# Patient Record
Sex: Male | Born: 1970
Health system: Southern US, Community
[De-identification: ages and names within clinical notes are randomized; demographics above are authoritative.]

## PROBLEM LIST (undated history)

## (undated) ENCOUNTER — Emergency Department (HOSPITAL_COMMUNITY): Admission: EM | Payer: Commercial Managed Care - PPO | Source: Home / Self Care

## (undated) DIAGNOSIS — L309 Dermatitis, unspecified: Secondary | ICD-10-CM

## (undated) DIAGNOSIS — K219 Gastro-esophageal reflux disease without esophagitis: Secondary | ICD-10-CM

## (undated) DIAGNOSIS — Z8619 Personal history of other infectious and parasitic diseases: Secondary | ICD-10-CM

## (undated) DIAGNOSIS — R51 Headache: Secondary | ICD-10-CM

## (undated) DIAGNOSIS — R490 Dysphonia: Secondary | ICD-10-CM

## (undated) DIAGNOSIS — E785 Hyperlipidemia, unspecified: Secondary | ICD-10-CM

## (undated) DIAGNOSIS — M545 Low back pain, unspecified: Secondary | ICD-10-CM

## (undated) HISTORY — DX: Dermatitis, unspecified: L30.9

## (undated) HISTORY — DX: Headache: R51

## (undated) HISTORY — DX: Low back pain, unspecified: M54.50

## (undated) HISTORY — PX: NO PAST SURGERIES: SHX2092

## (undated) HISTORY — DX: Hyperlipidemia, unspecified: E78.5

## (undated) HISTORY — DX: Gastro-esophageal reflux disease without esophagitis: K21.9

## (undated) HISTORY — DX: Personal history of other infectious and parasitic diseases: Z86.19

## (undated) HISTORY — DX: Dysphonia: R49.0

## (undated) HISTORY — DX: Low back pain: M54.5

---

## 2001-04-16 ENCOUNTER — Emergency Department (HOSPITAL_COMMUNITY): Admission: EM | Admit: 2001-04-16 | Discharge: 2001-04-16 | Payer: Self-pay | Admitting: Emergency Medicine

## 2001-04-16 ENCOUNTER — Encounter: Payer: Self-pay | Admitting: Emergency Medicine

## 2002-10-22 ENCOUNTER — Encounter: Payer: Self-pay | Admitting: Family Medicine

## 2002-10-22 ENCOUNTER — Encounter: Admission: RE | Admit: 2002-10-22 | Discharge: 2002-10-22 | Payer: Self-pay | Admitting: Family Medicine

## 2005-05-09 ENCOUNTER — Emergency Department (HOSPITAL_COMMUNITY): Admission: EM | Admit: 2005-05-09 | Discharge: 2005-05-09 | Payer: Self-pay | Admitting: Emergency Medicine

## 2006-01-27 ENCOUNTER — Emergency Department: Payer: Self-pay | Admitting: Emergency Medicine

## 2006-07-07 ENCOUNTER — Emergency Department: Payer: Self-pay

## 2007-04-24 ENCOUNTER — Ambulatory Visit: Payer: Self-pay | Admitting: Internal Medicine

## 2007-04-24 DIAGNOSIS — J309 Allergic rhinitis, unspecified: Secondary | ICD-10-CM | POA: Insufficient documentation

## 2007-04-24 DIAGNOSIS — R51 Headache: Secondary | ICD-10-CM

## 2007-04-24 LAB — CONVERTED CEMR LAB
ALT: 16 units/L (ref 0–53)
AST: 18 units/L (ref 0–37)
Albumin: 3.7 g/dL (ref 3.5–5.2)
Alkaline Phosphatase: 59 units/L (ref 39–117)
BUN: 8 mg/dL (ref 6–23)
Basophils Absolute: 0 10*3/uL (ref 0.0–0.1)
Basophils Relative: 0.6 % (ref 0.0–1.0)
Bilirubin, Direct: 0.1 mg/dL (ref 0.0–0.3)
CO2: 31 meq/L (ref 19–32)
Calcium: 9.3 mg/dL (ref 8.4–10.5)
Chloride: 106 meq/L (ref 96–112)
Cholesterol: 189 mg/dL (ref 0–200)
Creatinine, Ser: 1 mg/dL (ref 0.4–1.5)
Eosinophils Absolute: 0.3 10*3/uL (ref 0.0–0.6)
Eosinophils Relative: 6.4 % — ABNORMAL HIGH (ref 0.0–5.0)
GFR calc Af Amer: 109 mL/min
GFR calc non Af Amer: 90 mL/min
Glucose, Bld: 115 mg/dL — ABNORMAL HIGH (ref 70–99)
HCT: 39.9 % (ref 39.0–52.0)
HDL: 44.4 mg/dL (ref >39.0)
Hemoglobin: 12.8 g/dL — ABNORMAL LOW (ref 13.0–17.0)
LDL Cholesterol: 122 mg/dL — ABNORMAL HIGH (ref 0–99)
Lymphocytes Relative: 45.8 % (ref 12.0–46.0)
MCHC: 32.2 g/dL (ref 30.0–36.0)
MCV: 73 fL — ABNORMAL LOW (ref 78.0–100.0)
Monocytes Absolute: 0.3 10*3/uL (ref 0.2–0.7)
Monocytes Relative: 8.5 % (ref 3.0–11.0)
Neutro Abs: 1.5 10*3/uL (ref 1.4–7.7)
Neutrophils Relative %: 38.7 % — ABNORMAL LOW (ref 43.0–77.0)
Platelets: 279 10*3/uL (ref 150–400)
Potassium: 4.4 meq/L (ref 3.5–5.1)
RBC: 5.47 M/uL (ref 4.22–5.81)
RDW: 12 % (ref 11.5–14.6)
Sodium: 140 meq/L (ref 135–145)
TSH: 3.16 microintl units/mL (ref 0.35–5.50)
Total Bilirubin: 1 mg/dL (ref 0.3–1.2)
Total CHOL/HDL Ratio: 4.3
Total Protein: 7.3 g/dL (ref 6.0–8.3)
Triglycerides: 115 mg/dL (ref 0–149)
VLDL: 23 mg/dL (ref 0–40)
WBC: 4 10*3/uL — ABNORMAL LOW (ref 4.5–10.5)

## 2007-05-16 ENCOUNTER — Ambulatory Visit: Payer: Self-pay | Admitting: Family Medicine

## 2007-05-16 DIAGNOSIS — J069 Acute upper respiratory infection, unspecified: Secondary | ICD-10-CM | POA: Insufficient documentation

## 2007-06-19 ENCOUNTER — Encounter (INDEPENDENT_AMBULATORY_CARE_PROVIDER_SITE_OTHER): Payer: Self-pay | Admitting: *Deleted

## 2007-06-19 ENCOUNTER — Ambulatory Visit: Payer: Self-pay | Admitting: Internal Medicine

## 2007-06-19 DIAGNOSIS — J328 Other chronic sinusitis: Secondary | ICD-10-CM | POA: Insufficient documentation

## 2007-06-19 DIAGNOSIS — L309 Dermatitis, unspecified: Secondary | ICD-10-CM

## 2007-07-21 ENCOUNTER — Encounter: Payer: Self-pay | Admitting: Internal Medicine

## 2007-08-25 ENCOUNTER — Encounter: Payer: Self-pay | Admitting: Internal Medicine

## 2008-02-15 ENCOUNTER — Emergency Department: Payer: Self-pay | Admitting: Emergency Medicine

## 2008-03-10 ENCOUNTER — Ambulatory Visit: Payer: Self-pay | Admitting: Internal Medicine

## 2008-03-10 DIAGNOSIS — R490 Dysphonia: Secondary | ICD-10-CM | POA: Insufficient documentation

## 2008-03-11 ENCOUNTER — Ambulatory Visit: Payer: Self-pay | Admitting: Internal Medicine

## 2008-03-11 ENCOUNTER — Telehealth: Payer: Self-pay | Admitting: Internal Medicine

## 2008-03-17 ENCOUNTER — Encounter: Payer: Self-pay | Admitting: Internal Medicine

## 2008-03-21 ENCOUNTER — Encounter: Payer: Self-pay | Admitting: Internal Medicine

## 2008-04-07 ENCOUNTER — Encounter: Payer: Self-pay | Admitting: Internal Medicine

## 2008-06-28 ENCOUNTER — Ambulatory Visit: Payer: Self-pay | Admitting: Internal Medicine

## 2008-06-28 DIAGNOSIS — M545 Low back pain: Secondary | ICD-10-CM

## 2008-07-25 ENCOUNTER — Encounter: Admission: RE | Admit: 2008-07-25 | Discharge: 2008-08-18 | Payer: Self-pay | Admitting: Internal Medicine

## 2008-07-27 ENCOUNTER — Encounter: Payer: Self-pay | Admitting: Internal Medicine

## 2008-08-30 ENCOUNTER — Encounter: Payer: Self-pay | Admitting: Internal Medicine

## 2008-10-04 ENCOUNTER — Ambulatory Visit: Payer: Self-pay | Admitting: Family Medicine

## 2008-12-13 ENCOUNTER — Encounter: Payer: Self-pay | Admitting: Internal Medicine

## 2008-12-14 ENCOUNTER — Ambulatory Visit: Payer: Self-pay | Admitting: Internal Medicine

## 2008-12-27 ENCOUNTER — Ambulatory Visit: Payer: Self-pay | Admitting: Internal Medicine

## 2008-12-28 ENCOUNTER — Encounter: Payer: Self-pay | Admitting: Internal Medicine

## 2008-12-28 LAB — CONVERTED CEMR LAB
ALT: 13 units/L (ref 0–53)
AST: 17 units/L (ref 0–37)
Albumin: 4.6 g/dL (ref 3.5–5.2)
Alkaline Phosphatase: 49 units/L (ref 39–117)
BUN: 12 mg/dL (ref 6–23)
Basophils Absolute: 0 10*3/uL (ref 0.0–0.1)
Basophils Relative: 1 % (ref 0–1)
Bilirubin, Direct: 0.1 mg/dL (ref 0.0–0.3)
CO2: 20 meq/L (ref 19–32)
Calcium: 9.6 mg/dL (ref 8.4–10.5)
Chloride: 105 meq/L (ref 96–112)
Cholesterol: 272 mg/dL — ABNORMAL HIGH (ref 0–200)
Creatinine, Ser: 1.24 mg/dL (ref 0.40–1.50)
Eosinophils Absolute: 0.1 10*3/uL (ref 0.0–0.7)
Eosinophils Relative: 2 % (ref 0–5)
Glucose, Bld: 112 mg/dL — ABNORMAL HIGH (ref 70–99)
HCT: 40.1 % (ref 39.0–52.0)
HDL: 55 mg/dL (ref >39)
Hemoglobin: 13 g/dL (ref 13.0–17.0)
Indirect Bilirubin: 0.6 mg/dL (ref 0.0–0.9)
LDL Cholesterol: 195 mg/dL — ABNORMAL HIGH (ref 0–99)
Lymphocytes Relative: 37 % (ref 12–46)
Lymphs Abs: 1.7 10*3/uL (ref 0.7–4.0)
MCHC: 32.4 g/dL (ref 30.0–36.0)
MCV: 71.5 fL — ABNORMAL LOW (ref 78.0–100.0)
Monocytes Absolute: 0.4 10*3/uL (ref 0.1–1.0)
Monocytes Relative: 9 % (ref 3–12)
Neutro Abs: 2.4 10*3/uL (ref 1.7–7.7)
Neutrophils Relative %: 52 % (ref 43–77)
Platelets: 279 10*3/uL (ref 150–400)
Potassium: 4.7 meq/L (ref 3.5–5.3)
RBC: 5.61 M/uL (ref 4.22–5.81)
RDW: 12.6 % (ref 11.5–15.5)
Sodium: 141 meq/L (ref 135–145)
TSH: 1.078 microintl units/mL (ref 0.350–4.500)
Total Bilirubin: 0.7 mg/dL (ref 0.3–1.2)
Total CHOL/HDL Ratio: 4.9
Total Protein: 7.4 g/dL (ref 6.0–8.3)
Triglycerides: 110 mg/dL (ref <150)
VLDL: 22 mg/dL (ref 0–40)
WBC: 4.6 10*3/uL (ref 4.0–10.5)

## 2009-01-02 ENCOUNTER — Telehealth: Payer: Self-pay | Admitting: Internal Medicine

## 2009-01-13 ENCOUNTER — Encounter: Payer: Self-pay | Admitting: Internal Medicine

## 2009-01-23 ENCOUNTER — Encounter: Payer: Self-pay | Admitting: Internal Medicine

## 2009-02-28 ENCOUNTER — Encounter: Payer: Self-pay | Admitting: Internal Medicine

## 2009-05-17 ENCOUNTER — Encounter: Payer: Self-pay | Admitting: Internal Medicine

## 2010-03-26 ENCOUNTER — Ambulatory Visit: Payer: Self-pay | Admitting: Internal Medicine

## 2010-03-28 ENCOUNTER — Telehealth: Payer: Self-pay | Admitting: Internal Medicine

## 2010-03-28 ENCOUNTER — Encounter: Payer: Self-pay | Admitting: Internal Medicine

## 2010-03-28 LAB — CONVERTED CEMR LAB
ALT: 23 units/L (ref 0–53)
AST: 25 units/L (ref 0–37)
Albumin: 4.5 g/dL (ref 3.5–5.2)
Alkaline Phosphatase: 74 units/L (ref 39–117)
BUN: 14 mg/dL (ref 6–23)
Basophils Absolute: 0 10*3/uL (ref 0.0–0.1)
Basophils Relative: 0 % (ref 0–1)
Bilirubin, Direct: 0.2 mg/dL (ref 0.0–0.3)
CO2: 26 meq/L (ref 19–32)
Calcium: 9.5 mg/dL (ref 8.4–10.5)
Chloride: 102 meq/L (ref 96–112)
Cholesterol: 235 mg/dL — ABNORMAL HIGH (ref 0–200)
Creatinine, Ser: 1.16 mg/dL (ref 0.40–1.50)
Eosinophils Absolute: 0.4 10*3/uL (ref 0.0–0.7)
Eosinophils Relative: 3 % (ref 0–5)
Glucose, Bld: 101 mg/dL — ABNORMAL HIGH (ref 70–99)
HCT: 40.4 % (ref 39.0–52.0)
HDL: 60 mg/dL (ref >39)
Hemoglobin: 13.4 g/dL (ref 13.0–17.0)
Indirect Bilirubin: 0.8 mg/dL (ref 0.0–0.9)
LDL Cholesterol: 154 mg/dL — ABNORMAL HIGH (ref 0–99)
Lymphocytes Relative: 13 % (ref 12–46)
Lymphs Abs: 1.7 10*3/uL (ref 0.7–4.0)
MCHC: 33.2 g/dL (ref 30.0–36.0)
MCV: 70.5 fL — ABNORMAL LOW (ref 78.0–100.0)
Monocytes Absolute: 1.4 10*3/uL — ABNORMAL HIGH (ref 0.1–1.0)
Monocytes Relative: 11 % (ref 3–12)
Neutro Abs: 9.7 10*3/uL — ABNORMAL HIGH (ref 1.7–7.7)
Neutrophils Relative %: 74 % (ref 43–77)
Platelets: 241 10*3/uL (ref 150–400)
Potassium: 4.3 meq/L (ref 3.5–5.3)
RBC: 5.73 M/uL (ref 4.22–5.81)
RDW: 12.9 % (ref 11.5–15.5)
Sodium: 140 meq/L (ref 135–145)
TSH: 2.497 microintl units/mL (ref 0.350–4.500)
Total Bilirubin: 1 mg/dL (ref 0.3–1.2)
Total CHOL/HDL Ratio: 3.9
Total Protein: 7.5 g/dL (ref 6.0–8.3)
Triglycerides: 105 mg/dL (ref <150)
VLDL: 21 mg/dL (ref 0–40)
WBC: 13.2 10*3/uL — ABNORMAL HIGH (ref 4.0–10.5)

## 2010-04-02 ENCOUNTER — Ambulatory Visit: Payer: Self-pay | Admitting: Internal Medicine

## 2010-04-02 DIAGNOSIS — E785 Hyperlipidemia, unspecified: Secondary | ICD-10-CM | POA: Insufficient documentation

## 2010-06-05 NOTE — Miscellaneous (Signed)
Summary: lab orders  Clinical Lists Changes  Orders: Added new Test order of T-Basic Metabolic Panel ([id number]) - Signed Added new Test order of T-Hepatic Function ([id number]) - Signed Added new Test order of T-Lipid Profile ([id number]) - Signed Added new Test order of T-CBC w/Diff ([id number]) - Signed Added new Test order of T-TSH ([id number]) - Signed 

## 2010-06-05 NOTE — Assessment & Plan Note (Signed)
Summary: HA/Body aches/fever/ST/hea   Vital Signs:  Patient profile:   40 year old male Weight:      167.75 pounds BMI:     25.60 O2 Sat:      98 % on Room air Temp:     99.2 degrees F oral Pulse rate:   99 / minute BP sitting:   100 / 70  (left arm) Cuff size:   large  Vitals Entered By: Glendell Docker CMA (March 26, 2010 3:56 PM)  O2 Flow:  Room air CC: Body Aches Is Patient Diabetic? No Pain Assessment Patient in pain? no        Primary Care Provider:  Dondra Spry DO  CC:  Body Aches.  History of Present Illness:  40 year old male complains of severe sore throat, body aches, headaches, and fever No improvement with over-the-counter TheraFlu  Preventive Screening-Counseling & Management  Alcohol-Tobacco     Smoking Status: never  Allergies (verified): No Known Drug Allergies  Past History:  Past Medical History: Headache Allergic rhinitis    Hx of low back pain 2004 - associated with work injury   Family History: Father is in his early 87s - no significant medical history Mother is in her early 81s - no significant medical history Denies family history of colon cancer, diabetes, coronary artery disease   Social History: Occupation:  Oceanographer  Married with 4 children He is originally from [place] Never Smoked Alcohol use-no Drug use-no     Physical Exam  General:  alert, well-developed, and well-nourished.   Mouth:  pharyngeal erythema.   Neck:  supple.  bilateral neck tenderness Lungs:  normal respiratory effort and normal breath sounds.   Heart:  normal rate, regular rhythm, and no gallop.     Impression & Recommendations:  Problem # 1:  PHARYNGITIS-ACUTE (ICD-462)  His updated medication list for this problem includes:    Ibuprofen 200 Mg Tabs (Ibuprofen) .Marland Kitchen... Take 1 tablet by mouth two times a day as needed    Cefuroxime Axetil 500 Mg Tabs (Cefuroxime axetil) ..... One by mouth two times a day  Instructed to complete  antibiotics and call if not improved in 48 hours.   Complete Medication List: 1)  Ibuprofen 200 Mg Tabs (Ibuprofen) .... Take 1 tablet by mouth two times a day as needed 2)  Cyclobenzaprine Hcl 5 Mg Tabs (Cyclobenzaprine hcl) .... One by mouth at bedtime prn1 3)  Omeprazole 20 Mg Cpdr (Omeprazole) .... One by mouth qam 30 mins before meals 4)  Cefuroxime Axetil 500 Mg Tabs (Cefuroxime axetil) .... One by mouth two times a day 5)  Triamcinolone Acetonide 0.1 % Crea (Triamcinolone acetonide) .... Apply two times a day x 2 wks  Patient Instructions: 1)  Call our office if your symptoms do not  improve or gets worse. Prescriptions: CEFUROXIME AXETIL 500 MG TABS (CEFUROXIME AXETIL) one by mouth two times a day  #20 x 0   Entered and Authorized by:   D. Thomos Lemons DO   Signed by:   D. Thomos Lemons DO on 03/26/2010   Method used:   Electronically to        [facility] [phone]* (retail)       [address]       [place], [place]  [address]       Ph: [phone]       Fax: [phone]   RxID:   [id number]    Orders Added: 1)  Est. Patient  Level III [95284]    Current Allergies (reviewed today): No known allergies

## 2010-06-05 NOTE — Assessment & Plan Note (Signed)
Summary: CPX/HEA   Vital Signs:  Patient profile:   40 year old male Height:      68 inches Weight:      168.50 pounds BMI:     25.71 O2 Sat:      100 % on Room air Temp:     97.9 degrees F oral Resp:     58 per minute BP sitting:   110 / 80  (left arm) Cuff size:   large  Vitals Entered By: Glendell Docker CMA (April 02, 2010 2:41 PM)  O2 Flow:  Room air CC: CPX Is Patient Diabetic? No Pain Assessment Patient in pain? no      Comments 3 days remaining on antoibiotic   Primary Care Provider:  Dondra Spry DO  CC:  CPX.  History of Present Illness: 40 y/o AA male for routine cpx  recently seen for strep throat - feeling much better  Preventive Screening-Counseling & Management  Alcohol-Tobacco     Alcohol drinks/day: 0     Smoking Status: never  Caffeine-Diet-Exercise     Caffeine use/day: None     Does Patient Exercise: no  Allergies (verified): No Known Drug Allergies  Past History:  Past Medical History: Headache Allergic rhinitis    Hx of low back pain 2004 - associated with work injury    Family History: Father is in his early 36s - no significant medical history Mother is in her early 88s - no significant medical history Denies family history of colon cancer, diabetes, coronary artery disease    Social History: 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    Physical Exam  General:  alert, well-developed, and well-nourished.   Head:  normocephalic and atraumatic.   Ears:  R ear normal and L ear normal.   Mouth:  pharynx pink and moist.   Neck:  No deformities, masses, or tenderness  noted. Lungs:  Normal respiratory effort, chest expands symmetrically. Lungs are clear to auscultation, no crackles or wheezes. Heart:  normal rate, regular rhythm, and no gallop.   Abdomen:  soft, non-tender, normal bowel sounds, no masses, no hepatomegaly, and no splenomegaly.   Extremities:  No lower extremity edema  Neurologic:  cranial nerves II-XII intact and gait normal.     Impression & Recommendations:  Problem # 1:  HEALTH MAINTENANCE EXAM (ICD-V70.0) Reviewed adult health maintenance protocols. Pt counseled on diet and exercise.  Td Booster: Tdap (04/02/2010)   Flu Vax: Declined (04/02/2010)   Chol: 272 (12/28/2008)   HDL: 55 (12/28/2008)   LDL: 195 (12/28/2008)   TG: 110 (12/28/2008) TSH: 1.078 (12/28/2008)     Problem # 2:  HYPERLIPIDEMIA (ICD-272.4) I provided educational material.  His updated medication list for this problem includes:    Lovastatin 20 Mg Tabs (Lovastatin) ..... One by mouth q pm  Labs Reviewed: SGOT: 17 (12/28/2008)   SGPT: 13 (12/28/2008)   HDL:55 (12/28/2008), 44.4 (04/24/2007)  LDL:195 (12/28/2008), 122 (04/24/2007)  Chol:272 (12/28/2008), 189 (04/24/2007)  Trig:110 (12/28/2008), 115 (04/24/2007)  Complete Medication List: 1)  Ibuprofen 200 Mg Tabs (Ibuprofen) .... Take 1 tablet by mouth two times a day as needed 2)  Cyclobenzaprine Hcl 5 Mg Tabs (Cyclobenzaprine hcl) .Marland KitchenMarland KitchenMarland Kitchen  One by mouth at bedtime prn1 3)  Omeprazole 20 Mg Cpdr (Omeprazole) .... One by mouth qam 30 mins before meals 4)  Cefuroxime Axetil 500 Mg Tabs (Cefuroxime axetil) .... One by mouth two times a day 5)  Triamcinolone Acetonide 0.1 % Crea (Triamcinolone acetonide) .... Apply two times a day x 2 wks 6)  Lovastatin 20 Mg Tabs (Lovastatin) .... One by mouth q pm  Other Orders: Tdap => 72yrs IM ([address]109) Admin 1st Vaccine (60454)  Patient Instructions: 1)  Please schedule a follow-up appointment in 6 months. 2)  Hepatic Panel prior to visit, ICD-9:  272.4 3)  Lipid Panel prior  to visit, ICD-9: 272.4 4)  High sensitivity CRP :  272.4 5)  Please return for lab work one (1) week before your next appointment.  Prescriptions: LOVASTATIN 20 MG TABS (LOVASTATIN) one by mouth q pm  #90 x 1   Entered and Authorized by:   D. Thomos Lemons DO   Signed by:   D. Thomos Lemons DO on 04/02/2010   Method used:   Electronically to        [facility] [phone]* (retail)       [address]       [place], [place]  [address]       Ph: [phone]       Fax: [phone]   RxID:   [id number]    Orders Added: 1)  Tdap => 3yrs IM [90715] 2)  Admin 1st Vaccine [90471] 3)  Est. Patient 18-39 years [99395]   Immunization History:  Influenza Immunization History:    Influenza:  declined (04/02/2010)  Immunizations Administered:  Tetanus Vaccine:    Vaccine Type: Tdap    Site: left deltoid    Mfr: GlaxoSmithKline    Dose: 0.5 ml    Route: IM    Given by: Glendell Docker CMA    Exp. Date: 02/23/2012    Lot #: [id number]    VIS given: 03/23/08 version given April 02, 2010.   Contraindications/Deferment of Procedures/Staging:    Test/Procedure: FLU VAX    Reason for deferment: patient declined   Immunization History:  Influenza Immunization History:    Influenza:  Declined (04/02/2010)  Immunizations Administered:  Tetanus Vaccine:    Vaccine Type: Tdap    Site: left deltoid    Mfr: GlaxoSmithKline    Dose: 0.5 ml    Route: IM    Given by: Glendell Docker CMA    Exp. Date: 02/23/2012    Lot #: [id number]    VIS given: 03/23/08 version given April 02, 2010.   Orders Added: 1)  Tdap => 83yrs IM [90715] 2)  Admin 1st Vaccine [90471] 3)  Est. Patient 18-39 years [99395]   Current Allergies (reviewed today): No known allergies

## 2010-06-05 NOTE — Letter (Signed)
Summary: [facility]  [facility]   Imported By: Lanelle Bal 06/05/2009 12:58:16  _____________________________________________________________________  External Attachment:    Type:   Image     Comment:   External Document

## 2010-06-05 NOTE — Progress Notes (Signed)
Summary: PLEASE SEND RX FOR RASH CREAM --LM 11/23  Phone Note Call from Patient   Caller: Patient Call For: YOO  Summary of Call: DR Artist Pais WAS TO SEND RX FOR A CREAM FOR THE RASH THE PATIENT SHOWED HIM AT THE VISIT.  PLEASE SEND TO [facility] [address]  Initial call taken by: Roselle Locus,  March 28, 2010 11:33 AM  Follow-up for Phone Call        Rx sent to pharmacy. Left message for pt to return my call. Nicki Guadalajara Fergerson CMA Duncan Dull)  March 28, 2010 1:25 PM   Additional Follow-up for Phone Call Additional follow up Details #1::        call returne to patient at [phone], no answer. A detailed voice message was left informing patient rx sent to pharmacy. Message left for patient to call if any questions Additional Follow-up by: Glendell Docker CMA,  March 28, 2010 2:17 PM    New/Updated Medications: TRIAMCINOLONE ACETONIDE 0.1 % CREA (TRIAMCINOLONE ACETONIDE) apply two times a day x 2 wks Prescriptions: TRIAMCINOLONE ACETONIDE 0.1 % CREA (TRIAMCINOLONE ACETONIDE) apply two times a day x 2 wks  #30 grams x 1   Entered and Authorized by:   D. Thomos Lemons DO   Signed by:   D. Thomos Lemons DO on 03/28/2010   Method used:   Electronically to        [facility] [phone]* (retail)       [address]       [place], [place]  [address]       Ph: [phone]       Fax: [phone]   RxID:   [id number]

## 2010-09-27 ENCOUNTER — Encounter: Payer: Self-pay | Admitting: Internal Medicine

## 2010-10-05 ENCOUNTER — Ambulatory Visit: Payer: Self-pay | Admitting: Internal Medicine

## 2010-10-18 ENCOUNTER — Ambulatory Visit: Payer: Self-pay | Admitting: Internal Medicine

## 2010-10-22 ENCOUNTER — Other Ambulatory Visit: Payer: Self-pay | Admitting: Family Medicine

## 2010-10-22 ENCOUNTER — Encounter: Payer: Self-pay | Admitting: *Deleted

## 2010-10-22 ENCOUNTER — Ambulatory Visit (INDEPENDENT_AMBULATORY_CARE_PROVIDER_SITE_OTHER): Payer: BC Managed Care – PPO | Admitting: Family Medicine

## 2010-10-22 ENCOUNTER — Encounter: Payer: Self-pay | Admitting: Family Medicine

## 2010-10-22 VITALS — BP 110/80 | HR 76 | Temp 98.3°F | Resp 18 | Ht 68.0 in | Wt 166.0 lb

## 2010-10-22 DIAGNOSIS — J309 Allergic rhinitis, unspecified: Secondary | ICD-10-CM

## 2010-10-22 DIAGNOSIS — Z0289 Encounter for other administrative examinations: Secondary | ICD-10-CM

## 2010-10-22 DIAGNOSIS — Z23 Encounter for immunization: Secondary | ICD-10-CM

## 2010-10-22 DIAGNOSIS — IMO0001 Reserved for inherently not codable concepts without codable children: Secondary | ICD-10-CM

## 2010-10-22 DIAGNOSIS — K219 Gastro-esophageal reflux disease without esophagitis: Secondary | ICD-10-CM

## 2010-10-22 DIAGNOSIS — R498 Other voice and resonance disorders: Secondary | ICD-10-CM

## 2010-10-22 DIAGNOSIS — E785 Hyperlipidemia, unspecified: Secondary | ICD-10-CM

## 2010-10-22 DIAGNOSIS — Z020 Encounter for examination for admission to educational institution: Secondary | ICD-10-CM

## 2010-10-22 DIAGNOSIS — L259 Unspecified contact dermatitis, unspecified cause: Secondary | ICD-10-CM

## 2010-10-22 MED ORDER — RANITIDINE HCL 300 MG PO TABS: 300.0000 mg | ORAL_TABLET | Freq: Every day | ORAL | Status: DC

## 2010-10-22 NOTE — Patient Instructions (Signed)
Heartburn Heartburn is a painful, burning sensation in the chest. It may feel worse in certain positions, such as lying down or bending over. It is caused by stomach acid backing up into the tube that carries food from the mouth down to the stomach (lower esophagus).  CAUSES A number of conditions can cause or worsen heartburn, including:   Pregnancy.   Being overweight (obesity).   A condition called hiatal hernia, in which part or all of the stomach is moved up into the chest through a weakness in the diaphragm muscle.   Alcohol.   Exercise.   Eating just before going to bed.   Overeating.   Medications, including:   Nonsteroidal anti-inflammatory drugs, such as ibuprofen and naproxen.   Aspirin.   Some blood pressure medicines, including beta-blockers, calcium channel blockers, and alpha-blockers.   Nitrates (used to treat angina).   The asthma medication Theophylline.   Certain sedative drugs.   Heartburn may be worse after eating certain foods. These heartburn-causing foods are different for different people, but may include:   Peppers.   Chocolate.   Coffee.   High-fat foods, including fried foods.   Spicy foods.   Garlic, onions.   Citrus fruits, including oranges, grapefruit, lemons and limes.   Food containing tomatoes or tomato products.   Mint.   Carbonated beverages.   Vinegar.  SYMPTOMS  Symptoms may last for a few minutes or a few hours, and can include:  Burning pain in the chest or lower throat.   Bitter taste in the mouth.   Coughing.  DIAGNOSIS If the usual treatments for heartburn do not improve your symptoms, then tests may be done to see if there is another condition present. Possible tests may include:  X-rays.   Endoscopy. This is when a tube with a light and a camera on the end is used to examine the esophagus and the stomach.   Blood, breath, or stool tests may be used to check for bacteria that cause ulcers.   TREATMENT There are a number of non-prescription medicines used to treat heartburn, including:  Antacids.   Acid reducers (also called H-2 blockers).   Proton-pump inhibitors.  HOME CARE INSTRUCTIONS  Raise the head of your bed by putting blocks under the legs.   Eat 2-3 hours before going to bed.   Stop smoking.   Try to reach and maintain a healthy weight.   Do not eat just a few very large meals. Instead, eat many smaller meals throughout the day.   Try to identify foods and beverages that make your symptoms worse, and avoid these.   Avoid tight clothing.   Do not exercise right after eating.  SEEK IMMEDIATE MEDICAL CARE IF YOU:  Have severe chest pain that goes down your arm, or into your jaw or neck.   Feel sweaty, dizzy, or lightheaded.   Are short of breath.   Throw up (vomit) blood.   Have difficulty or pain with swallowing.   Have bloody or black, tarry stools.   Have bouts of heartburn more than three times a week for more than two weeks.  Document Released: 09/08/2008 Document Re-Released: 07/17/2009 [facility] Patient Information 2011 [place], [place].   If your hoarseness does not get better on the Ranitidine after a month or so add Loratadine (Claritin) 10 mg daily and take both for a month to see if it gets better, if it does not please come in to have this reevaluated

## 2010-10-22 NOTE — Telephone Encounter (Signed)
This encounter was created in error - please disregard.

## 2010-10-23 LAB — RUBEOLA ANTIBODY IGG: Rubeola IgG: 5.14 {ISR} — ABNORMAL HIGH

## 2010-10-23 LAB — VARICELLA ZOSTER ANTIBODY, IGG: Varicella IgG: 1.76 {ISR} — ABNORMAL HIGH

## 2010-10-23 LAB — HEPATITIS PANEL, ACUTE
Hep A IgM: NEGATIVE
Hep B C IgM: NEGATIVE
Hepatitis B Surface Ag: NEGATIVE

## 2010-10-24 ENCOUNTER — Encounter: Payer: Self-pay | Admitting: Family Medicine

## 2010-10-24 DIAGNOSIS — R490 Dysphonia: Secondary | ICD-10-CM | POA: Insufficient documentation

## 2010-10-24 DIAGNOSIS — K219 Gastro-esophageal reflux disease without esophagitis: Secondary | ICD-10-CM | POA: Insufficient documentation

## 2010-10-24 DIAGNOSIS — L309 Dermatitis, unspecified: Secondary | ICD-10-CM | POA: Insufficient documentation

## 2010-10-24 DIAGNOSIS — M549 Dorsalgia, unspecified: Secondary | ICD-10-CM | POA: Insufficient documentation

## 2010-10-24 DIAGNOSIS — J309 Allergic rhinitis, unspecified: Secondary | ICD-10-CM | POA: Insufficient documentation

## 2010-10-24 DIAGNOSIS — Z020 Encounter for examination for admission to educational institution: Secondary | ICD-10-CM | POA: Insufficient documentation

## 2010-10-24 DIAGNOSIS — E785 Hyperlipidemia, unspecified: Secondary | ICD-10-CM | POA: Insufficient documentation

## 2010-10-24 NOTE — Progress Notes (Signed)
Cody Frank [id number] Nov 01, 1970 10/24/2010      Progress Note-Follow Up  Subjective  Chief Complaint  No chief complaint on file.   HPI  Patient is a 40 year old [place] American male from [place] who is in today for school physical. He has been accepted into her respiratory therapy to program at [facility]. He needs to prove immunity so he will be sent for lab work today. He continues to have intermittent episodes of hoarseness and low back pain. The low back pain does respond in a dermatome and Flexeril when necessary and he has none today. The hoarseness is present slightly today but comes and goes. He does have intermittent mild reflux and intermittent nasal. Is not taking any medications for these at this time. No recent febrile illness, chest pain, palpitations, shortness of breath her symptoms are noted at this time.  Past Medical History  Diagnosis Date  . Headache   . History of low back pain 2004    associated with work injury  . Allergic rhinitis   . Hyperlipidemia   . GERD (gastroesophageal reflux disease)   . Eczema   . Low back pain syndrome   . Hoarseness   . School physical exam 10/24/2010    History reviewed. No pertinent past surgical history.  Family History  Problem Relation Age of Onset  . Other Neg Hx     father in his early 49's and mother in her early 92's no significant  medical history  . Other Neg Hx     denies family history of colon cancer, diabetes, coronary artery disease    History   Social History  . Marital Status: Married    Spouse Name: N/A    Number of Children: N/A  . Years of Education: N/A   Occupational History  . Not on file.   Social History Main Topics  . Smoking status: Never Smoker   . Smokeless tobacco: Not on file  . Alcohol Use: No  . Drug Use: No  . Sexually Active: Not on file   Other Topics Concern  . 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 cyclobenzaprine (FLEXERIL) 5 MG tablet Take 5 mg by mouth at bedtime as needed.        . triamcinolone (KENALOG) 0.1 % cream Apply topically 2 (two) times daily.          Not on File  Review of Systems  Review of Systems  Constitutional: Negative for fever, chills and malaise/fatigue.  HENT: Positive for congestion. Negative for hearing loss, nosebleeds and sore throat.        Mild hoarseness, intermittent  Eyes: Negative for discharge.  Respiratory: Negative for cough, sputum production, shortness of breath and wheezing.   Cardiovascular: Negative for chest pain, palpitations and leg swelling.  Gastrointestinal: Positive for heartburn. Negative for nausea, vomiting, abdominal pain, diarrhea, constipation and blood in stool.  Genitourinary: Negative for dysuria, urgency, frequency and hematuria.  Musculoskeletal: Positive for back pain. Negative for myalgias and falls.       Occasional low back pain, none now, responsive to Flexeril and Nabumetone   Skin: Negative for rash.  Neurological: Negative for dizziness, tremors, sensory change, focal weakness, loss of consciousness, weakness and headaches.  Endo/Heme/Allergies: Negative for polydipsia. Does not bruise/bleed easily.  Psychiatric/Behavioral: Negative for depression and suicidal ideas. The patient is not nervous/anxious and does not have insomnia.     Objective  BP 110/80  Pulse 76  Temp(Src) 98.3 F (36.8 C) (Oral)  Resp 18  Ht 5\' 8"  (1.727 m)  Wt 166 lb (75.297 kg)  BMI 25.24 kg/m2  Physical Exam  Physical Exam  Constitutional: He is oriented to person, place, and time and well-developed, well-nourished, and in no distress. No distress.  HENT:  Head: Normocephalic and atraumatic.  Eyes: Conjunctivae are  normal.  Neck: Neck supple. No thyromegaly present.  Cardiovascular: Normal rate, regular rhythm and normal heart sounds.   No murmur heard. Pulmonary/Chest: Effort normal and breath sounds normal. No respiratory distress.  Abdominal: He exhibits no distension and no mass. There is no tenderness.  Musculoskeletal: He exhibits no edema.  Neurological: He is alert and oriented to person, place, and time.  Skin: Skin is warm. Rash noted.       Small, raised, rough circular patch along medial aspect of right elbow. No surrounding fluctuance, roughly 1 cm in diameter  Psychiatric: Memory, affect and judgment normal.    Lab Results  Component Value Date   TSH 2.497 03/28/2010   Lab Results  Component Value Date   WBC 13.2* 03/28/2010   HGB 13.4 03/28/2010   HCT 40.4 03/28/2010   MCV 70.5* 03/28/2010   PLT 241 03/28/2010   Lab Results  Component Value Date   CREATININE 1.16 03/28/2010   BUN 14 03/28/2010   NA 140 03/28/2010   K 4.3 03/28/2010   CL 102 03/28/2010   CO2 26 03/28/2010   Lab Results  Component Value Date   ALT 23 03/28/2010   AST 25 03/28/2010   ALKPHOS 74 03/28/2010   BILITOT 1.0 03/28/2010   Lab Results  Component Value Date   CHOL 235* 03/28/2010   Lab Results  Component Value Date   HDL 60 03/28/2010   Lab Results  Component Value Date   LDLCALC 154* 03/28/2010   Lab Results  Component Value Date   TRIG 105 03/28/2010   Lab Results  Component Value Date   CHOLHDL 3.9 Ratio 03/28/2010     Assessment & Plan  HYPERLIPIDEMIA Is no longer taking his statin but is trying to avoid fatty foods and he is encouraged to take fish oil supplements daily and stay active.  GERD (gastroesophageal reflux disease) Has intermittent symptoms, encouraged to avoid offending foods and not eat too close to bedtime, is started on Ranitidine to see if it helps his hoarseness as well  ECZEMA Well controlled at present. May continue topical treatments prn and  needs to maintain adequate hydration.  ALLERGIC RHINITIS Encouraged a daily dose of either Loratadine or Cetirizine to see if the hoarseness improves.  HOARSENESS Will begin treatment for both reflux and allergies then if hoarseness persists he will need to notify the office so he can obtain referral to ENT for evaluation of his oropharynx and vocal cords.   School physical exam Has been accepted to [facility] into their respiratory therapy program. He is sent for labs to evaluate proof of immunity to MMR and Hepatitis. He does not have patient contact initially but he is encouraged to have a PPD checked and immunizations completely before he has patient contact. Forms completed today

## 2010-10-24 NOTE — Assessment & Plan Note (Signed)
Has intermittent symptoms, encouraged to avoid offending foods and not eat too close to bedtime, is started on Ranitidine to see if it helps his hoarseness as well

## 2010-10-24 NOTE — Assessment & Plan Note (Signed)
Has been accepted to [facility] into their respiratory therapy program. He is sent for labs to evaluate proof of immunity to MMR and Hepatitis. He does not have patient contact initially but he is encouraged to have a PPD checked and immunizations completely before he has patient contact. Forms completed today

## 2010-10-24 NOTE — Assessment & Plan Note (Signed)
Well controlled at present. May continue topical treatments prn and needs to maintain adequate hydration.

## 2010-10-24 NOTE — Assessment & Plan Note (Signed)
Is no longer taking his statin but is trying to avoid fatty foods and he is encouraged to take fish oil supplements daily and stay active.

## 2010-10-24 NOTE — Assessment & Plan Note (Signed)
Will begin treatment for both reflux and allergies then if hoarseness persists he will need to notify the office so he can obtain referral to ENT for evaluation of his oropharynx and vocal cords.

## 2010-10-24 NOTE — Assessment & Plan Note (Signed)
Encouraged a daily dose of either Loratadine or Cetirizine to see if the hoarseness improves.

## 2010-10-29 ENCOUNTER — Ambulatory Visit (INDEPENDENT_AMBULATORY_CARE_PROVIDER_SITE_OTHER): Payer: BC Managed Care – PPO | Admitting: Family

## 2010-10-29 DIAGNOSIS — Z111 Encounter for screening for respiratory tuberculosis: Secondary | ICD-10-CM

## 2010-10-31 ENCOUNTER — Ambulatory Visit (INDEPENDENT_AMBULATORY_CARE_PROVIDER_SITE_OTHER): Payer: BC Managed Care – PPO | Admitting: Family

## 2010-10-31 ENCOUNTER — Ambulatory Visit (HOSPITAL_BASED_OUTPATIENT_CLINIC_OR_DEPARTMENT_OTHER)
Admission: RE | Admit: 2010-10-31 | Discharge: 2010-10-31 | Disposition: A | Discharge status: Home / Self Care | Payer: BC Managed Care – PPO | Source: Ambulatory Visit | Attending: Family | Admitting: Family

## 2010-10-31 ENCOUNTER — Encounter: Payer: Self-pay | Admitting: Family

## 2010-10-31 VITALS — BP 100/70 | HR 78 | Temp 98.0°F | Resp 16

## 2010-10-31 DIAGNOSIS — R7611 Nonspecific reaction to tuberculin skin test without active tuberculosis: Secondary | ICD-10-CM | POA: Insufficient documentation

## 2010-10-31 NOTE — Assessment & Plan Note (Signed)
We will have patient complete a chest x-ray to be certain that there is no pulmonary disease. However suspect that his PPD is positive due to prior history of BCG vaccination.

## 2010-10-31 NOTE — Patient Instructions (Signed)
Please complete your CXR on the first floor. We will provide you with a letter for you school after this is completed.

## 2010-10-31 NOTE — Progress Notes (Signed)
  Subjective:    Patient ID: Cody Frank, male    DOB: January 26, 1971, 40 y.o.   MRN: [id number]  HPI  Mr. Fauble is a 40 year old male who presents today for PPD reading. He is noted to have induration at the site of PPD placement. Upon further questioning he reports that he did have the BCG vaccine. He grew up in [place].  Review of Systems     Objective:   Physical Exam  Constitutional: He appears well-developed and well-nourished.  Skin:       13 mm of induration noted on patient's left forearm, at the PPD site. A scar is noted on the left upper arm which appears consistent with prior BCG vaccination.      PPD+ 13 mm    Assessment & Plan:

## 2010-11-23 ENCOUNTER — Ambulatory Visit (INDEPENDENT_AMBULATORY_CARE_PROVIDER_SITE_OTHER): Payer: BC Managed Care – PPO | Admitting: Internal Medicine

## 2010-11-23 ENCOUNTER — Encounter: Payer: BC Managed Care – PPO | Admitting: Internal Medicine

## 2010-11-23 ENCOUNTER — Encounter: Payer: Self-pay | Admitting: Internal Medicine

## 2010-11-23 VITALS — BP 114/82 | Temp 98.5°F | Ht 69.0 in | Wt 168.8 lb

## 2010-11-23 DIAGNOSIS — Z0000 Encounter for general adult medical examination without abnormal findings: Secondary | ICD-10-CM

## 2010-11-23 DIAGNOSIS — R498 Other voice and resonance disorders: Secondary | ICD-10-CM

## 2010-11-23 LAB — CBC WITH DIFFERENTIAL/PLATELET
Eosinophils Relative: 8 % — ABNORMAL HIGH (ref 0–5)
HCT: 38.1 % — ABNORMAL LOW (ref 39.0–52.0)
Hemoglobin: 12.4 g/dL — ABNORMAL LOW (ref 13.0–17.0)
Lymphocytes Relative: 42 % (ref 12–46)
Lymphs Abs: 2 10*3/uL (ref 0.7–4.0)
MCV: 70.4 fL — ABNORMAL LOW (ref 78.0–100.0)
Monocytes Absolute: 0.4 10*3/uL (ref 0.1–1.0)
Monocytes Relative: 8 % (ref 3–12)
Platelets: 284 10*3/uL (ref 150–400)
RBC: 5.41 MIL/uL (ref 4.22–5.81)
WBC: 4.9 10*3/uL (ref 4.0–10.5)

## 2010-11-23 LAB — BASIC METABOLIC PANEL
CO2: 27 mEq/L (ref 19–32)
Chloride: 105 mEq/L (ref 96–112)
Creat: 1.04 mg/dL (ref 0.50–1.35)

## 2010-11-23 LAB — HEPATIC FUNCTION PANEL
Albumin: 4.5 g/dL (ref 3.5–5.2)
Alkaline Phosphatase: 57 U/L (ref 39–117)
Total Bilirubin: 0.7 mg/dL (ref 0.3–1.2)

## 2010-11-23 LAB — LIPID PANEL
HDL: 47 mg/dL (ref >39)
LDL Cholesterol: 187 mg/dL — ABNORMAL HIGH (ref 0–99)

## 2010-11-23 MED ORDER — FLUTICASONE PROPIONATE 50 MCG/ACT NA SUSP: 2.0000 | Freq: Every day | NASAL | Status: DC

## 2010-11-24 DIAGNOSIS — Z0000 Encounter for general adult medical examination without abnormal findings: Secondary | ICD-10-CM | POA: Insufficient documentation

## 2010-11-24 LAB — URINALYSIS
Ketones, ur: NEGATIVE mg/dL
Leukocytes, UA: NEGATIVE
Nitrite: NEGATIVE
Specific Gravity, Urine: 1.023 (ref 1.005–1.030)
pH: 7 (ref 5.0–8.0)

## 2010-11-24 NOTE — Assessment & Plan Note (Signed)
Obtain CBC, Chem-7, urinalysis, fasting lipid profile and liver function tests.

## 2010-11-24 NOTE — Assessment & Plan Note (Signed)
Suspect possible contribution from chronic rhinitis. Attempt Flonase 2 sprays each nostril q.h.s.Followup if no improvement or worsening. If symptoms persist without improvement consider ENT consultation

## 2010-11-24 NOTE — Progress Notes (Signed)
  Subjective:    Patient ID: Cody Frank, male    DOB: December 20, 1970, 40 y.o.   MRN: [id number]  HPI patient presents to clinic for complete physical exam. History of hyperlipidemia not currently requiring medication. Next recent penile rash with possible left inguinal lymph node. Rash was itchy at the distal penis. No urethral discharge. Attempted over-the-counter cream and rash and lymph node resolved entirely. Complains of chronic intermittent hoarseness partially improved by PPI. Does complain of nasal congestion and drainage. No other exacerbating or alleviating factors. No other complaints  Reviewed past medical history, medications and allergies    Review of Systems  HENT: Positive for congestion, rhinorrhea and voice change.   All other systems reviewed and are negative.        Objective:   Physical Exam    Physical Exam  Vitals reviewed. Constitutional:  appears well-developed and well-nourished. No distress.  HENT:  Head: Normocephalic and atraumatic. Pupils equal round reactive to light, EOM grossly intact. Right Ear: Tympanic membrane, external ear and ear canal normal.  Left Ear: Tympanic membrane, external ear and ear canal normal.  Nose: Nose normal.  Mouth/Throat: Oropharynx is clear and moist. No oropharyngeal exudate.  Eyes: Conjunctivae and EOM are normal. Pupils are equal, round, and reactive to light. Right eye exhibits no discharge. Left eye exhibits no discharge. No scleral icterus.  Neck: Neck supple. No thyromegaly present.  Cardiovascular: Normal rate, regular rhythm and normal heart sounds.  Exam reveals no gallop and no friction rub.   No murmur heard. Abdomen: Soft, nondistended, nontender to palpation, positive bowel sounds. No masses organomegaly Pulmonary/Chest: Effort normal and breath sounds normal. No respiratory distress.  has no wheezes.  has no rales.  Lymphadenopathy:   no cervical adenopathy.  GU: Bilaterally descended testes without  nodularity tenderness or mass. No inguinal adenopathy. No penile rash noted Neurological:  is alert.  Skin: Skin is warm and dry.  not diaphoretic.  Psychiatric: normal mood and affect.      Assessment & Plan:

## 2010-11-28 ENCOUNTER — Other Ambulatory Visit: Payer: Self-pay | Admitting: *Deleted

## 2010-11-28 DIAGNOSIS — Z0000 Encounter for general adult medical examination without abnormal findings: Secondary | ICD-10-CM

## 2010-11-28 DIAGNOSIS — E785 Hyperlipidemia, unspecified: Secondary | ICD-10-CM

## 2010-11-28 MED ORDER — ATORVASTATIN CALCIUM 20 MG PO TABS: 20.0000 mg | ORAL_TABLET | Freq: Every day | ORAL | Status: DC

## 2010-12-19 ENCOUNTER — Telehealth: Payer: Self-pay | Admitting: Internal Medicine

## 2010-12-19 NOTE — Telephone Encounter (Signed)
Patient states that he needs a hepatitis B shot. I'm not sure he the doctor would want to see him for this?

## 2010-12-19 NOTE — Telephone Encounter (Signed)
Call placed to patient at [phone],he was asked about his request for Hepatitis B vaccination. Patient is requesting the Hepatitis B shot for school. He stated that he was informed by his school his recent blood work shows that he does not have immunity. He stated that he would like to have the vaccination although it is not required by the school for him to have.

## 2010-12-19 NOTE — Telephone Encounter (Signed)
Ok to begin the series

## 2010-12-20 NOTE — Telephone Encounter (Signed)
Patient presented to office today as walk in to discuss the hepatitis B injection. He was informed that it was okay to start Hepatitis B series. He is undecided and states that after he checks with his program coordinator for school he will call back to schedule nurse visit to start series. If he he has decided not to start series he will sign a waiver at the school. Nothing further needed at this time.

## 2011-02-04 ENCOUNTER — Ambulatory Visit: Payer: BC Managed Care – PPO | Admitting: Internal Medicine

## 2011-02-18 ENCOUNTER — Ambulatory Visit: Payer: BC Managed Care – PPO

## 2011-04-08 ENCOUNTER — Ambulatory Visit: Payer: BC Managed Care – PPO | Admitting: Family

## 2011-04-09 ENCOUNTER — Ambulatory Visit (HOSPITAL_BASED_OUTPATIENT_CLINIC_OR_DEPARTMENT_OTHER)
Admission: RE | Admit: 2011-04-09 | Discharge: 2011-04-09 | Disposition: A | Discharge status: Home / Self Care | Payer: BC Managed Care – PPO | Source: Ambulatory Visit | Attending: Internal Medicine | Admitting: Internal Medicine

## 2011-04-09 ENCOUNTER — Ambulatory Visit: Payer: BC Managed Care – PPO | Admitting: Family

## 2011-04-09 ENCOUNTER — Ambulatory Visit (INDEPENDENT_AMBULATORY_CARE_PROVIDER_SITE_OTHER): Payer: BC Managed Care – PPO | Admitting: Internal Medicine

## 2011-04-09 DIAGNOSIS — R071 Chest pain on breathing: Secondary | ICD-10-CM

## 2011-04-09 DIAGNOSIS — E785 Hyperlipidemia, unspecified: Secondary | ICD-10-CM

## 2011-04-09 DIAGNOSIS — R079 Chest pain, unspecified: Secondary | ICD-10-CM

## 2011-04-09 DIAGNOSIS — R0789 Other chest pain: Secondary | ICD-10-CM | POA: Insufficient documentation

## 2011-04-09 DIAGNOSIS — M545 Low back pain: Secondary | ICD-10-CM

## 2011-04-09 DIAGNOSIS — R300 Dysuria: Secondary | ICD-10-CM

## 2011-04-09 DIAGNOSIS — R0989 Other specified symptoms and signs involving the circulatory and respiratory systems: Secondary | ICD-10-CM | POA: Insufficient documentation

## 2011-04-09 MED ORDER — CYCLOBENZAPRINE HCL 5 MG PO TABS: 5.0000 mg | ORAL_TABLET | Freq: Every evening | ORAL | Status: DC | PRN

## 2011-04-10 ENCOUNTER — Encounter: Payer: Self-pay | Admitting: Internal Medicine

## 2011-04-10 DIAGNOSIS — R0789 Other chest pain: Secondary | ICD-10-CM | POA: Insufficient documentation

## 2011-04-10 LAB — URINALYSIS, ROUTINE W REFLEX MICROSCOPIC
Bilirubin Urine: NEGATIVE
Ketones, ur: NEGATIVE mg/dL
Nitrite: NEGATIVE
Specific Gravity, Urine: 1.027 (ref 1.005–1.030)
pH: 7 (ref 5.0–8.0)

## 2011-04-10 LAB — LIPID PANEL
LDL Cholesterol: 152 mg/dL — ABNORMAL HIGH (ref 0–99)
VLDL: 30 mg/dL (ref 0–40)

## 2011-04-10 LAB — HEPATIC FUNCTION PANEL
Alkaline Phosphatase: 66 U/L (ref 39–117)
Indirect Bilirubin: 0.6 mg/dL (ref 0.0–0.9)
Total Bilirubin: 0.7 mg/dL (ref 0.3–1.2)

## 2011-04-10 NOTE — Progress Notes (Signed)
  Subjective:    Patient ID: Cody Frank, male    DOB: [birth year], 40 y.o.   MRN: [id number]  HPI Pt presents to clinic for evaluation of multiple complaints. Notes 4d h/o left lateral chest wall pain without injury. Does not radiate and is not associated with fever, chills, abdominal pain, n/v or hematuria. Has recent chest congestion without cough, hemoptysis or dyspnea. Notes also intermittent brief (secs) right lbp without radiation. Does note rare intermittent dysuria. No alleviating or exacerbating factors. H/o hyperlipidemia but states is taking lipitor only 2-3days per week. No myalgias or other side effects.   Past Medical History  Diagnosis Date  . Headache   . History of low back pain 2004    associated with work injury  . Allergic rhinitis   . Hyperlipidemia   . GERD (gastroesophageal reflux disease)   . Eczema   . Low back pain syndrome   . Hoarseness   . School physical exam 10/24/2010   No past surgical history on file.  reports that he has never smoked. He does not have any smokeless tobacco history on file. He reports that he does not drink alcohol or use illicit drugs. family history is negative for Other and Other. No Known Allergies   Review of Systems see hpi     Objective:   Physical Exam  Nursing note and vitals reviewed. Constitutional: He appears well-developed and well-nourished. No distress.  HENT:  Head: Normocephalic and atraumatic.  Eyes: Conjunctivae are normal. No scleral icterus.  Neck: Neck supple.  Cardiovascular: Normal rate, regular rhythm and normal heart sounds.  Exam reveals no gallop and no friction rub.   No murmur heard. Pulmonary/Chest: Effort normal and breath sounds normal. No respiratory distress. He has no wheezes. He has no rales.  Abdominal: Soft. Bowel sounds are normal. He exhibits no distension. There is no tenderness. There is no rebound.  Musculoskeletal:       Left lateral cw NT without bony abn.   Neurological: He is  alert.  Skin: Skin is warm and dry. He is not diaphoretic.  Psychiatric: He has a normal mood and affect.          Assessment & Plan:

## 2011-04-10 NOTE — Assessment & Plan Note (Signed)
Obtain cxr pa/lat. Obtain UA given unilateral side pain and rare dysuria

## 2011-04-10 NOTE — Assessment & Plan Note (Signed)
Obtain lipid/lft. Taking statin sporadically

## 2011-04-10 NOTE — Assessment & Plan Note (Signed)
Attempt flexeril prn. Cautioned re possible sedating effect

## 2011-05-09 ENCOUNTER — Telehealth: Payer: Self-pay | Admitting: *Deleted

## 2011-05-09 NOTE — Telephone Encounter (Signed)
Received form from pt requesting documentation of previous vaccination history for college. Pt appears to be up to date on all required vaccines. Recommended Vaccines are hepatitis B and flu vaccine which we have no record of. Form completed and forwarded to Provider for signature.

## 2011-05-10 ENCOUNTER — Ambulatory Visit (INDEPENDENT_AMBULATORY_CARE_PROVIDER_SITE_OTHER): Payer: BC Managed Care – PPO | Admitting: Internal Medicine

## 2011-05-10 ENCOUNTER — Encounter: Payer: Self-pay | Admitting: Internal Medicine

## 2011-05-10 VITALS — BP 100/78 | HR 57 | Temp 97.7°F | Resp 16 | Wt 170.0 lb

## 2011-05-10 DIAGNOSIS — R51 Headache: Secondary | ICD-10-CM

## 2011-05-10 DIAGNOSIS — L259 Unspecified contact dermatitis, unspecified cause: Secondary | ICD-10-CM

## 2011-05-10 DIAGNOSIS — E785 Hyperlipidemia, unspecified: Secondary | ICD-10-CM

## 2011-05-10 MED ORDER — DICLOFENAC SODIUM 75 MG PO TBEC: DELAYED_RELEASE_TABLET | ORAL | Status: AC

## 2011-05-10 MED ORDER — ATORVASTATIN CALCIUM 20 MG PO TABS
20.0000 mg | ORAL_TABLET | Freq: Every day | ORAL | Status: DC
Start: 2011-05-10 — End: 2012-09-11

## 2011-05-10 MED ORDER — FLUOCINONIDE-E 0.05 % EX CREA: TOPICAL_CREAM | Freq: Two times a day (BID) | CUTANEOUS | Status: AC

## 2011-05-10 NOTE — Telephone Encounter (Signed)
Pt seen by Dr Rodena Medin today and form was given to pt.

## 2011-05-11 NOTE — Progress Notes (Signed)
  Subjective:    Patient ID: Cody Frank, male    DOB: March 02, 1971, 41 y.o.   MRN: [id number]  HPI Pt presents to clinic for evaluation of multiple medical complaints. Notes two week h/o posterior neck pain without radicular pain. No injury/trauma. Has associated posterior ha's without neurologic sx's. Taking no medication for the problem. H/o chronic intermittent low back pain. No alleviating or exacerbating factors. Also notes rash primarily involving left ant tibial area. Rash is dry and mildly raised. No drainage or wound. Rash is not spreading. S/p dermatology evaluation and given ?triamcinilone without improvement. No other complaints.  Past Medical History  Diagnosis Date  . Headache   . History of low back pain 2004    associated with work injury  . Allergic rhinitis   . Hyperlipidemia   . GERD (gastroesophageal reflux disease)   . Eczema   . Low back pain syndrome   . Hoarseness   . School physical exam 10/24/2010   No past surgical history on file.  reports that he has never smoked. He has never used smokeless tobacco. He reports that he does not drink alcohol or use illicit drugs. family history is negative for Other and Other. No Known Allergies   Review of Systems see hpi     Objective:   Physical Exam  Nursing note and vitals reviewed. Constitutional: He appears well-developed and well-nourished. No distress.  HENT:  Head: Normocephalic and atraumatic.  Right Ear: External ear normal.  Left Ear: External ear normal.  Eyes: Conjunctivae are normal. No scleral icterus.  Neck: Neck supple.  Musculoskeletal:       FROM neck. No midline cervical tenderness or bony abn. Some pain at nuchal insertion.   Neurological: He is alert.  Skin: Skin is warm and dry. Rash noted. He is not diaphoretic. No erythema.       Dry mildly raised rash left anterior tibial area          Assessment & Plan:

## 2011-05-11 NOTE — Assessment & Plan Note (Signed)
Reviewed lipid results. Improved but above target. Recommend taking statin daily

## 2011-05-11 NOTE — Assessment & Plan Note (Signed)
Attempt lidex. If refractory then consider dermatology re-evaluation

## 2011-05-11 NOTE — Assessment & Plan Note (Signed)
Presentation suggestive of tension ha's. Attempt flexeril prn with voltaren (take with food and no other nsaids). Defers cspine radiograph. Followup if no improvement or worsening.

## 2011-06-17 ENCOUNTER — Ambulatory Visit (INDEPENDENT_AMBULATORY_CARE_PROVIDER_SITE_OTHER): Payer: Self-pay | Admitting: Internal Medicine

## 2011-06-17 ENCOUNTER — Encounter: Payer: Self-pay | Admitting: Internal Medicine

## 2011-06-17 VITALS — BP 100/70 | HR 74 | Temp 98.0°F | Resp 18 | Ht 69.0 in | Wt 172.0 lb

## 2011-06-17 DIAGNOSIS — J069 Acute upper respiratory infection, unspecified: Secondary | ICD-10-CM | POA: Insufficient documentation

## 2011-06-17 MED ORDER — OSELTAMIVIR PHOSPHATE 75 MG PO CAPS: 75.0000 mg | ORAL_CAPSULE | Freq: Two times a day (BID) | ORAL | Status: AC

## 2011-06-17 NOTE — Progress Notes (Signed)
  Subjective:    Patient ID: Cody Frank, male    DOB: 09-27-1970, 41 y.o.   MRN: [id number]  HPI Pt presents to clinic for evaluation of fever. Notes sudden onset of subjective fever, frontal ha, nasal congestion/drainage and diffuse myalgias/fatigue. Taking nasal spray without improvement. No known sick exposures. No other alleviating or exacerbating factors. No other complaints.  Past Medical History  Diagnosis Date  . Headache   . History of low back pain 2004    associated with work injury  . Allergic rhinitis   . Hyperlipidemia   . GERD (gastroesophageal reflux disease)   . Eczema   . Low back pain syndrome   . Hoarseness   . School physical exam 10/24/2010   No past surgical history on file.  reports that he has never smoked. He has never used smokeless tobacco. He reports that he does not drink alcohol or use illicit drugs. family history is negative for Other and Other. No Known Allergies   Review of Systems  Constitutional: Positive for fever and fatigue. Negative for chills.  HENT: Positive for congestion and rhinorrhea. Negative for neck pain and neck stiffness.   Respiratory: Negative for cough and shortness of breath.   Skin: Negative for rash.       Objective:   Physical Exam  Nursing note and vitals reviewed. Constitutional: He appears well-developed and well-nourished. No distress.  HENT:  Head: Normocephalic and atraumatic.  Right Ear: Tympanic membrane, external ear and ear canal normal.  Left Ear: Tympanic membrane, external ear and ear canal normal.  Nose: Nose normal.  Mouth/Throat: Oropharynx is clear and moist. No oropharyngeal exudate.  Eyes: Conjunctivae are normal. Right eye exhibits no discharge. Left eye exhibits no discharge. No scleral icterus.  Neck: Neck supple.  Cardiovascular: Normal rate, regular rhythm and normal heart sounds.  Exam reveals no gallop and no friction rub.   No murmur heard. Pulmonary/Chest: Effort normal and breath  sounds normal. No respiratory distress. He has no wheezes. He has no rales.  Lymphadenopathy:    He has no cervical adenopathy.  Neurological: He is alert.  Skin: Skin is warm and dry. He is not diaphoretic.  Psychiatric: He has a normal mood and affect.          Assessment & Plan:

## 2011-06-17 NOTE — Assessment & Plan Note (Signed)
Suspect influenza viral illness. Discussed otc sx tx prn. Attempt tamiflu 75mg  bid x 5d. Increase fluid intake. Followup if no improvement or worsening.

## 2011-09-09 ENCOUNTER — Ambulatory Visit: Payer: Self-pay | Admitting: Internal Medicine

## 2011-11-25 ENCOUNTER — Encounter: Payer: BC Managed Care – PPO | Admitting: Internal Medicine

## 2011-11-25 DIAGNOSIS — Z0289 Encounter for other administrative examinations: Secondary | ICD-10-CM

## 2012-01-17 ENCOUNTER — Ambulatory Visit: Payer: Self-pay | Admitting: Internal Medicine

## 2012-04-17 ENCOUNTER — Ambulatory Visit: Payer: Self-pay | Admitting: Internal Medicine

## 2012-05-05 ENCOUNTER — Ambulatory Visit: Payer: Self-pay | Admitting: Internal Medicine

## 2012-05-08 ENCOUNTER — Ambulatory Visit: Payer: Self-pay | Admitting: Internal Medicine

## 2012-05-14 ENCOUNTER — Ambulatory Visit (INDEPENDENT_AMBULATORY_CARE_PROVIDER_SITE_OTHER): Payer: BC Managed Care – PPO | Admitting: Internal Medicine

## 2012-05-14 ENCOUNTER — Encounter: Payer: Self-pay | Admitting: Internal Medicine

## 2012-05-14 VITALS — BP 100/80 | HR 81 | Temp 97.7°F | Resp 16 | Wt 172.0 lb

## 2012-05-14 DIAGNOSIS — R21 Rash and other nonspecific skin eruption: Secondary | ICD-10-CM

## 2012-05-14 DIAGNOSIS — Z23 Encounter for immunization: Secondary | ICD-10-CM

## 2012-05-14 DIAGNOSIS — H538 Other visual disturbances: Secondary | ICD-10-CM

## 2012-05-14 DIAGNOSIS — J069 Acute upper respiratory infection, unspecified: Secondary | ICD-10-CM

## 2012-05-14 MED ORDER — AZITHROMYCIN 250 MG PO TABS: ORAL_TABLET | ORAL | Status: AC

## 2012-05-14 MED ORDER — NYSTATIN 100000 UNIT/GM EX OINT: TOPICAL_OINTMENT | Freq: Two times a day (BID) | CUTANEOUS | Status: DC

## 2012-05-17 DIAGNOSIS — R21 Rash and other nonspecific skin eruption: Secondary | ICD-10-CM | POA: Insufficient documentation

## 2012-05-17 DIAGNOSIS — H538 Other visual disturbances: Secondary | ICD-10-CM | POA: Insufficient documentation

## 2012-05-17 NOTE — Assessment & Plan Note (Signed)
States he will call his eye doctor for an evaluation

## 2012-05-17 NOTE — Progress Notes (Signed)
  Subjective:    Patient ID: Cody Frank, male    DOB: [birth year], 42 y.o.   MRN: [id number]  HPI Pt presents to clinic for evaluaiton of cough. Notes over one week h/o nasal congestion and mild cough without hemoptysis. No fever/chills or dyspnea. States stopped lipitor due to side effects. Has several  Month h/o perirectal itch rash. Previously resolved with unspecified cream. Has intermittent blurry vision without dizziness or syncope. No associated neurologic sx's.  Past Medical History  Diagnosis Date  . Headache   . History of low back pain 2004    associated with work injury  . Allergic rhinitis   . Hyperlipidemia   . GERD (gastroesophageal reflux disease)   . Eczema   . Low back pain syndrome   . Hoarseness   . School physical exam 10/24/2010   No past surgical history on file.  reports that he has never smoked. He has never used smokeless tobacco. He reports that he does not drink alcohol or use illicit drugs. family history is negative for Other and Other. No Known Allergies   Review of Systems see hpi     Objective:   Physical Exam  Nursing note and vitals reviewed. Constitutional: He appears well-developed and well-nourished. No distress.  HENT:  Head: Normocephalic and atraumatic.  Right Ear: External ear normal.  Left Ear: External ear normal.  Nose: Nose normal.  Mouth/Throat: Oropharynx is clear and moist. No oropharyngeal exudate.  Eyes: Conjunctivae normal are normal. No scleral icterus.  Neck: Neck supple.  Cardiovascular: Normal rate, regular rhythm and normal heart sounds.   Pulmonary/Chest: Effort normal and breath sounds normal. No respiratory distress. He has no wheezes. He has no rales.  Skin: Skin is warm and dry. Rash noted. He is not diaphoretic.       Slight perirectal irritation without lesion. No mass or hemorrhoid noted.          Assessment & Plan:

## 2012-05-17 NOTE — Assessment & Plan Note (Signed)
Begin abx tx. Followup if no improvement or worsening.  

## 2012-05-17 NOTE — Assessment & Plan Note (Signed)
Attempt nystatin. Followup if no improvement or worsening.  

## 2012-06-11 ENCOUNTER — Telehealth: Payer: Self-pay | Admitting: *Deleted

## 2012-06-11 NOTE — Telephone Encounter (Signed)
Received message from pt on 06/10/12 stating he had stopped nystatin cream for rectal itching. Remainder of message was difficult to understand. Left message for pt to return my call for clarification.

## 2012-06-11 NOTE — Telephone Encounter (Signed)
Pt returned my call and states that itching was not improved by the cream and is very intense. He has stopped Nystatin and want to know if there is something else he can try? Pt would like rx called to [facility] [place] [address].

## 2012-06-12 MED ORDER — FLUCONAZOLE 150 MG PO TABS: 150.0000 mg | ORAL_TABLET | Freq: Once | ORAL | Status: DC

## 2012-06-12 NOTE — Telephone Encounter (Signed)
Try diflucan.  If not improved by early next week needs to be seen back in office pls.

## 2012-06-12 NOTE — Telephone Encounter (Signed)
Attempted to reach pt and received message that voice mailbox was full. Will try again later.

## 2012-06-15 ENCOUNTER — Encounter: Payer: Self-pay | Admitting: Family Medicine

## 2012-06-15 DIAGNOSIS — Z0289 Encounter for other administrative examinations: Secondary | ICD-10-CM

## 2012-06-15 NOTE — Telephone Encounter (Signed)
Left detailed message on cell and to call if any questions. 

## 2012-06-30 ENCOUNTER — Ambulatory Visit: Payer: BC Managed Care – PPO | Admitting: Internal Medicine

## 2012-07-03 ENCOUNTER — Ambulatory Visit (INDEPENDENT_AMBULATORY_CARE_PROVIDER_SITE_OTHER): Payer: BC Managed Care – PPO | Admitting: Family

## 2012-07-03 ENCOUNTER — Encounter: Payer: Self-pay | Admitting: Family

## 2012-07-03 VITALS — BP 116/80 | HR 57 | Temp 97.7°F | Resp 18 | Wt 171.0 lb

## 2012-07-03 DIAGNOSIS — L290 Pruritus ani: Secondary | ICD-10-CM

## 2012-07-03 DIAGNOSIS — K649 Unspecified hemorrhoids: Secondary | ICD-10-CM | POA: Insufficient documentation

## 2012-07-03 LAB — POCT RAPID STREP A (OFFICE): Rapid Strep A Screen: POSITIVE — AB

## 2012-07-03 MED ORDER — AMOXICILLIN 500 MG PO CAPS: 500.0000 mg | ORAL_CAPSULE | Freq: Three times a day (TID) | ORAL | Status: DC

## 2012-07-03 MED ORDER — HYDROCORTISONE ACE-PRAMOXINE 1-1 % RE FOAM: 1.0000 | Freq: Two times a day (BID) | RECTAL | Status: DC

## 2012-07-03 NOTE — Progress Notes (Signed)
Subjective:    Patient ID: Cody Frank, male    DOB: [birth year], 42 y.o.   MRN: [id number]  HPI  Anal itching- reports that he was given a cream in the past which helped.  Has tried nystatin cream which has not helped at all.  Also used diflucan tab which helped briefly.  Has been ongoing x >1 month. Denies hx of hemorrhoids.  Slight blood on toilet tissue from the affected area. No recent travel. Family members without similar symptoms.   Sore throat-  Reports that this has been present x 1 week. Has used mucinex.  No improvement.    Review of Systems See HPI  Past Medical History  Diagnosis Date  . Headache   . History of low back pain 2004    associated with work injury  . Allergic rhinitis   . Hyperlipidemia   . GERD (gastroesophageal reflux disease)   . Eczema   . Low back pain syndrome   . Hoarseness   . School physical exam 10/24/2010    History   Social History  . Marital Status: Married    Spouse Name: N/A    Number of Children: N/A  . Years of Education: N/A   Occupational History  . Not on file.   Social History Main Topics  . Smoking status: Never Smoker   . Smokeless tobacco: Never Used  . Alcohol Use: No  . Drug Use: No  . Sexually Active: Not on file   Other Topics Concern  . 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  Family History  Problem Relation Age of Onset  . Other Neg Hx     father in his early 82's and mother in her early 69's no significant  medical history  . Other Neg Hx     denies family history of colon cancer, diabetes, coronary artery disease    No Known Allergies  Current Outpatient Prescriptions on File Prior to Visit  Medication Sig Dispense Refill  . atorvastatin (LIPITOR) 20 MG tablet Take 1  tablet (20 mg total) by mouth daily.  30 tablet  6  . nystatin ointment (MYCOSTATIN) Apply topically 2 (two) times daily.  60 g  0   No current facility-administered medications on file prior to visit.    BP 116/80  Pulse 57  Temp(Src) 97.7 F (36.5 C) (Oral)  Resp 18  Wt 171 lb 0.6 oz (77.583 kg)  BMI 25.25 kg/m2  SpO2 99%       Objective:   Physical Exam  Constitutional: He is oriented to person, place, and time. He appears well-developed and well-nourished. He appears distressed.  HENT:  Head: Normocephalic and atraumatic.  Right Ear: Tympanic membrane and ear canal normal.  Left Ear: Tympanic membrane and ear canal normal.  Mild oropharyngeal erythema, no exudated  Cardiovascular: Normal rate and regular rhythm.   No murmur heard. Musculoskeletal: He exhibits no edema.  Lymphadenopathy:    He has no cervical adenopathy.  Neurological: He is alert and oriented to person, place, and time.  Skin: Skin is warm and dry. No rash noted. No erythema. No pallor.  Psychiatric: He has a normal mood and affect. His behavior  is normal. Judgment and thought content normal.  Rectal- Normal external anal exam.  No erythema, rash, lesions, or external hemorrhoids noted.        Assessment & Plan:

## 2012-07-03 NOTE — Assessment & Plan Note (Signed)
Rapid strep is +.  Will rx with amoxicillin.  

## 2012-07-03 NOTE — Patient Instructions (Addendum)
Call if itching worsens or if it does not improve in 1 week.

## 2012-07-03 NOTE — Assessment & Plan Note (Signed)
Recommended trial of proctofoam.  He is instructed to call if symptoms worsen or if no improvement in 1 week. Would consider stool O and P at that time.

## 2012-07-08 ENCOUNTER — Telehealth: Payer: Self-pay | Admitting: Internal Medicine

## 2012-07-08 MED ORDER — CEFUROXIME AXETIL 500 MG PO TABS: 500.0000 mg | ORAL_TABLET | Freq: Two times a day (BID) | ORAL | Status: DC

## 2012-07-08 NOTE — Telephone Encounter (Signed)
Pt left message requesting a return call at [phone]. Attempted to reach pt and left message on voicemail re: info. Below and to call if any questions.

## 2012-07-08 NOTE — Telephone Encounter (Signed)
Left message with pt's wife to have pt return my call. 

## 2012-07-08 NOTE — Telephone Encounter (Signed)
Patient was given an antibiotic last week and is now having upset stomach and vomiting/nausea. He would like to know if this could be a side effect to the antibiotic.

## 2012-07-08 NOTE — Telephone Encounter (Signed)
Yes could be side effect.  He should stop amoxicillin, switch to ceftin. Call if symptoms worsen or do not improve in next 24 hours.

## 2012-08-17 ENCOUNTER — Ambulatory Visit: Payer: BC Managed Care – PPO | Admitting: Family

## 2012-08-17 DIAGNOSIS — Z0289 Encounter for other administrative examinations: Secondary | ICD-10-CM

## 2012-08-24 ENCOUNTER — Ambulatory Visit (INDEPENDENT_AMBULATORY_CARE_PROVIDER_SITE_OTHER): Payer: BC Managed Care – PPO | Admitting: Internal Medicine

## 2012-08-24 ENCOUNTER — Ambulatory Visit: Payer: BC Managed Care – PPO | Admitting: Internal Medicine

## 2012-08-24 ENCOUNTER — Other Ambulatory Visit: Payer: Self-pay | Admitting: Internal Medicine

## 2012-08-24 ENCOUNTER — Encounter: Payer: Self-pay | Admitting: Internal Medicine

## 2012-08-24 VITALS — BP 104/70 | HR 73 | Temp 97.0°F | Ht 69.0 in | Wt 174.0 lb

## 2012-08-24 DIAGNOSIS — J309 Allergic rhinitis, unspecified: Secondary | ICD-10-CM

## 2012-08-24 DIAGNOSIS — K921 Melena: Secondary | ICD-10-CM | POA: Insufficient documentation

## 2012-08-24 DIAGNOSIS — M79609 Pain in unspecified limb: Secondary | ICD-10-CM

## 2012-08-24 DIAGNOSIS — L290 Pruritus ani: Secondary | ICD-10-CM

## 2012-08-24 DIAGNOSIS — M79601 Pain in right arm: Secondary | ICD-10-CM

## 2012-08-24 MED ORDER — PREDNISONE 10 MG PO TABS: ORAL_TABLET | ORAL | Status: DC

## 2012-08-24 MED ORDER — PREDNISONE 10 MG PO TABS: 10.0000 mg | ORAL_TABLET | Freq: Every day | ORAL | Status: DC

## 2012-08-24 MED ORDER — METHYLPREDNISOLONE ACETATE 80 MG/ML IJ SUSP
80.0000 mg | Freq: Once | INTRAMUSCULAR | Status: AC
  Administered 2012-08-24: 80 mg via INTRAMUSCULAR

## 2012-08-24 MED ORDER — FLUTICASONE PROPIONATE 50 MCG/ACT NA SUSP: 2.0000 | Freq: Every day | NASAL | Status: DC

## 2012-08-24 NOTE — Telephone Encounter (Signed)
Corrected rx sent

## 2012-08-24 NOTE — Progress Notes (Signed)
Subjective:    Patient ID: Cody Frank, male    DOB: 1971/01/11, 42 y.o.   MRN: [id number]  HPI  Here with [facility], has been onoging for over a month, seen and tx initially with amoxil feb 28 , then ceftin, but states no better. Much sinus congestion, minor non prod cough, itchy throat and sinus.  DId try allegra and zyrtec recently without much relief. No recent fever or worsening pain, Most recent symptoms worse in past wk.   Also mentions a dysethetic feeling to RUE, but no neck pain or UE weakness or nubmbness.  Mentions frustration over cost of recent care and mult meds recent that did not seem to help as he expected. Also had a pruritis ani type symptoms where the topical cream did not help.  Still itches a lot, and has noticed some trace blood with wiping. Did not go back to PCP this visit Past Medical History  Diagnosis Date  . Headache   . History of low back pain 2004    associated with work injury  . Allergic rhinitis   . Hyperlipidemia   . GERD (gastroesophageal reflux disease)   . Eczema   . Low back pain syndrome   . Hoarseness   . School physical exam 10/24/2010   No past surgical history on file.  reports that he has never smoked. He has never used smokeless tobacco. He reports that he does not drink alcohol or use illicit drugs. family history is negative for Other and Other. Allergies  Allergen Reactions  . Amoxicillin     vomitting   Current Outpatient Prescriptions on File Prior to Visit  Medication Sig Dispense Refill  . hydrocortisone-pramoxine (PROCTOFOAM-HC) rectal foam Place 1 applicator rectally 2 (two) times daily.  10 g  0  . nystatin ointment (MYCOSTATIN) Apply topically 2 (two) times daily.  60 g  0  . atorvastatin (LIPITOR) 20 MG tablet Take 1 tablet (20 mg total) by mouth daily.  30 tablet  6  . cefUROXime (CEFTIN) 500 MG tablet Take 1 tablet (500 mg total) by mouth 2 (two) times daily.  12 tablet  0   No current facility-administered medications on file  prior to visit.   Review of Systems  Constitutional: Negative for unexpected weight change, or unusual diaphoresis  HENT: Negative for tinnitus.   Eyes: Negative for photophobia and visual disturbance.  Respiratory: Negative for choking and stridor.   Gastrointestinal: Negative for vomiting and blood in stool.  Genitourinary: Negative for hematuria and decreased urine volume.  Musculoskeletal: Negative for acute joint swelling Skin: Negative for color change and wound.  Neurological: Negative for tremors and numbness other than noted  Psychiatric/Behavioral: Negative for decreased concentration or  hyperactivity.       Objective:   Physical Exam BP 104/70  Pulse 73  Temp(Src) 97 F (36.1 C) (Oral)  Ht 5\' 9"  (1.753 m)  Wt 174 lb (78.926 kg)  BMI 25.68 kg/m2  SpO2 98% VS noted, not acutely ill appaering, but uncomfortable, frustrated but not agitated or hostile Constitutional: Pt appears well-developed and well-nourished./obese  HENT: Head: NCAT.  Right Ear: External ear normal.  Left Ear: External ear normal.  Bilat tm's with mild erythema.  Max sinus areas non tender.  Pharynx with mild erythema, no exudate Eyes: Conjunctivae and EOM are normal. Pupils are equal, round, and reactive to light.  Neck: Normal range of motion. Neck supple.  Cardiovascular: Normal rate and regular rhythm.   Pulmonary/Chest: Effort normal and breath sounds  normal.  Abd:  Soft, NT, non-distended, + BS, rectal deferred per pt Neurological: Pt is alert. Not confused  Skin: Skin is warm. No erythema. No rash Psychiatric: Pt behavior is normal. Thought content normal. , mild irritable     Assessment & Plan:

## 2012-08-24 NOTE — Patient Instructions (Addendum)
You had the steroid shot today, which should hopefully help the sinuses, arm and rectal itching Please take all new medication as prescribed - the prednisone (anti-inflammatory medication), and the flonase nasal spray  (to take for at least the next month) Please return if the arm discomfort worsens, as you may need further evaluation for possible carpal tunnel or other nerve related issue Please continue all other medications as before Please call for Gastroenterology referral if you notice further bleeding

## 2012-08-26 NOTE — Assessment & Plan Note (Signed)
With prob recent seasonal flare, for depomedrol IM, predpack asd, tried to educate and reassure

## 2012-08-26 NOTE — Assessment & Plan Note (Signed)
Dysesthetic type discomfort, exam o/w benign, suspect mild neuritic ? CTS vs ulnar vs other,  Mild, declines further eval, to f/u any worsening symptoms or concerns

## 2012-08-26 NOTE — Assessment & Plan Note (Signed)
D/w pt, I think should consider colonoscopy given age but declines

## 2012-08-26 NOTE — Assessment & Plan Note (Signed)
Hopefully to improve as well with steroid tx, to f/u any worsening symptoms or concerns

## 2012-09-11 ENCOUNTER — Encounter: Payer: Self-pay | Admitting: Internal Medicine

## 2012-09-11 ENCOUNTER — Ambulatory Visit (INDEPENDENT_AMBULATORY_CARE_PROVIDER_SITE_OTHER): Payer: BC Managed Care – PPO | Admitting: Internal Medicine

## 2012-09-11 VITALS — BP 122/74 | Temp 98.5°F | Wt 172.0 lb

## 2012-09-11 DIAGNOSIS — L290 Pruritus ani: Secondary | ICD-10-CM

## 2012-09-11 DIAGNOSIS — J309 Allergic rhinitis, unspecified: Secondary | ICD-10-CM

## 2012-09-11 DIAGNOSIS — L259 Unspecified contact dermatitis, unspecified cause: Secondary | ICD-10-CM

## 2012-09-11 MED ORDER — ALBENDAZOLE 200 MG PO TABS: ORAL_TABLET | ORAL | Status: DC

## 2012-09-11 MED ORDER — PRAMOXINE-HC 1-1 % EX CREA: TOPICAL_CREAM | Freq: Three times a day (TID) | CUTANEOUS | Status: DC

## 2012-09-11 MED ORDER — FLUTICASONE PROPIONATE 50 MCG/ACT NA SUSP: 2.0000 | Freq: Every day | NASAL | Status: DC

## 2012-09-11 MED ORDER — CLOBETASOL PROPIONATE 0.05 % EX CREA: TOPICAL_CREAM | Freq: Two times a day (BID) | CUTANEOUS | Status: DC

## 2012-09-11 NOTE — Assessment & Plan Note (Signed)
Patient has fair sized plaque on left shin.  Smaller scaly lesion right elbow. Use clobetasol cream as directed.

## 2012-09-11 NOTE — Assessment & Plan Note (Addendum)
Patient has tried multiple courses of topical steroids without improvement. His symptoms are worse at night.  He has tiny white spots near anal area. Question pinworm. Use antiparasitic - albendazole . Reassess in 6 weeks.

## 2012-09-11 NOTE — Assessment & Plan Note (Signed)
I agree patient's chronic sore throat likely secondary to postnasal drip from allergic rhinitis. Patient encouraged to start fluticasone nose spray. Patient also advised to use over-the-counter Allegra and intranasal saline.

## 2012-09-11 NOTE — Progress Notes (Signed)
Subjective:    Patient ID: Cody Frank, male    DOB: 1971-01-25, 42 y.o.   MRN: [id number]  HPI  42 year old African male previously seen for chronic sore throat and anal pruritus for followup. Patient was initially seen for similar symptoms since February of 2014. Despite treatment with ProctoFoam and other rectal steroids patient having persistent anal itching. He denies poor hygiene. His anal itching seems to be particularly worse at night.  Patient also complains of chronic sore throat. He was last seen by Dr. Jonny Ruiz in April. He was given shot of Depo-Medrol. He did not take nasal steroids and prednisone as directed. Patient reports steroid shot helped a little, then symptoms returned. Patient complains of postnasal drip. He also reports being more susceptible to viral URIs.  Review of Systems Negative for chronic headaches,  He reports itchy red eyes  Past Medical History  Diagnosis Date  . Headache   . History of low back pain 2004    associated with work injury  . Allergic rhinitis   . Hyperlipidemia   . GERD (gastroesophageal reflux disease)   . Eczema   . Low back pain syndrome   . Hoarseness   . School physical exam 10/24/2010    History   Social History  . Marital Status: Married    Spouse Name: N/A    Number of Children: N/A  . Years of Education: N/A   Occupational History  . Not on file.   Social History Main Topics  . Smoking status: Never Smoker   . Smokeless tobacco: Never Used  . Alcohol Use: No  . Drug Use: No  . Sexually Active: Not on file   Other Topics Concern  . 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  Family History  Problem Relation Age of Onset  . Other Neg Hx     father in his early 30's and mother in her early  14's no significant  medical history  . Other Neg Hx     denies family history of colon cancer, diabetes, coronary artery disease    Allergies  Allergen Reactions  . Amoxicillin     vomitting    No current outpatient prescriptions on file prior to visit.   No current facility-administered medications on file prior to visit.    BP 122/74  Temp(Src) 98.5 F (36.9 C) (Oral)  Wt 172 lb (78.019 kg)  BMI 25.39 kg/m2       Objective:   Physical Exam  Constitutional: He is oriented to person, place, and time. He appears well-developed and well-nourished.  HENT:  Head: Normocephalic and atraumatic.  Right Ear: External ear normal.  Left Ear: External ear normal.  Signs of postnasal drip,  Boggy left nasal turbinate  Cardiovascular: Normal rate, regular rhythm and normal heart sounds.   Pulmonary/Chest: Effort normal and breath sounds normal. He has no wheezes.  Genitourinary:  Small external hemorrhoid ( 6 o'clock position ). Small tiny white spots around rectal area. No internal hemorrhoids. Normal prostate exam  Neurological: He is alert and oriented to person, place, and time.  Skin:  4 x 6 cm plaque left shin  Psychiatric: He has a normal mood and  affect. His behavior is normal.          Assessment & Plan:

## 2012-09-15 ENCOUNTER — Telehealth: Payer: Self-pay | Admitting: Internal Medicine

## 2012-09-15 NOTE — Telephone Encounter (Signed)
Seen in the office 09/11/12; given rx for Albenza; Scott says when he went to pick up the rx, the four tabs were going to cost over $400; would like something cheaper, if possible; uses [facility] on [address]; please call back

## 2012-09-15 NOTE — Telephone Encounter (Signed)
Please call pharmacy to see if there is generic or equivalent alternative

## 2012-09-16 NOTE — Telephone Encounter (Signed)
I have never use stromectol and I am not what kind of side effects he may experience.  I suggest he pick up one dose of albendazole.  Also he may want to call [facility] to see if it is less expensive at their pharmacy.

## 2012-09-16 NOTE — Telephone Encounter (Signed)
Pharmacist said you may want to consider stromectol.  It looks like that one would be cheaper

## 2012-09-16 NOTE — Telephone Encounter (Signed)
Left message for pt to call back  °

## 2012-09-22 NOTE — Telephone Encounter (Signed)
Pt aware, he will call [facility]

## 2012-10-02 ENCOUNTER — Ambulatory Visit: Payer: BC Managed Care – PPO | Admitting: Internal Medicine

## 2012-10-06 ENCOUNTER — Ambulatory Visit (INDEPENDENT_AMBULATORY_CARE_PROVIDER_SITE_OTHER): Payer: BC Managed Care – PPO | Admitting: Internal Medicine

## 2012-10-06 VITALS — BP 106/80 | HR 68 | Temp 97.9°F | Resp 16 | Ht 69.0 in | Wt 172.0 lb

## 2012-10-06 DIAGNOSIS — L290 Pruritus ani: Secondary | ICD-10-CM

## 2012-10-06 DIAGNOSIS — J312 Chronic pharyngitis: Secondary | ICD-10-CM

## 2012-10-06 MED ORDER — PANTOPRAZOLE SODIUM 40 MG PO TBEC: 40.0000 mg | DELAYED_RELEASE_TABLET | Freq: Every day | ORAL | Status: DC

## 2012-10-06 NOTE — Assessment & Plan Note (Signed)
Albendazole cost prohibitive.  He symptoms resolved with last trial of steroid cream.

## 2012-10-06 NOTE — Progress Notes (Signed)
Subjective:    Patient ID: Cody Frank, male    DOB: 11/29/1970, 42 y.o.   MRN: [id number]  HPI  42 year old African American male with previous he seen for chronic sore throat and anal pruritus for followup. Patient was not able to obtain antiparasitic medications due to cost. However his anal pruritus significantly improved with steroid cream.  Patient seen one to 2 months ago at urgent care for chronic sore throat. He recalls taking 3-4 days of an antibiotic. He has persistent symptoms.  He denies any tender lymph nodes in his neck. His symptoms predominantly worse in the evening. They are left-sided. He denies any dysphasia. He has intermittent reflux symptoms.  Review of Systems    negative for fatigue  Past Medical History  Diagnosis Date  . Headache(784.0)   . History of low back pain 2004    associated with work injury  . Allergic rhinitis   . Hyperlipidemia   . GERD (gastroesophageal reflux disease)   . Eczema   . Low back pain syndrome   . Hoarseness   . School physical exam 10/24/2010    History   Social History  . Marital Status: Married    Spouse Name: N/A    Number of Children: N/A  . Years of Education: N/A   Occupational History  . Not on file.   Social History Main Topics  . Smoking status: Never Smoker   . Smokeless tobacco: Never Used  . Alcohol Use: No  . Drug Use: No  . Sexually Active: Not on file   Other Topics Concern  . 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  Family History  Problem Relation Age of Onset  . Other Neg Hx     father in his early 40's and mother in her early 66's no significant  medical history  . Other Neg Hx     denies family history of colon cancer, diabetes, coronary artery disease     Allergies  Allergen Reactions  . Amoxicillin     vomitting    Current Outpatient Prescriptions on File Prior to Visit  Medication Sig Dispense Refill  . clobetasol cream (TEMOVATE) 0.05 % Apply topically 2 (two) times daily. Use for eczema  30 g  2  . fluticasone (FLONASE) 50 MCG/ACT nasal spray Place 2 sprays into the nose daily.  16 g  3  . pramoxine-hydrocortisone (ANALPRAM HC) cream Apply topically 3 (three) times daily.  30 g  0   No current facility-administered medications on file prior to visit.    BP 106/80  Pulse 68  Temp(Src) 97.9 F (36.6 C)  Resp 16  Ht 5\' 9"  (1.753 m)  Wt 172 lb (78.019 kg)  BMI 25.39 kg/m2    Objective:   Physical Exam  Constitutional: He appears well-developed and well-nourished.  HENT:  Head: Normocephalic and atraumatic.  Right Ear: External ear normal.  Left Ear: External ear normal.  Mouth/Throat: No oropharyngeal exudate.  Oropharyngeal erythema  Cardiovascular: Normal rate, regular rhythm and normal heart sounds.   Pulmonary/Chest: Effort normal and breath sounds normal. He has no wheezes.          Assessment & Plan:

## 2012-10-06 NOTE — Assessment & Plan Note (Signed)
42 year old [place] American male with chronic sore throat for 1 to 2 months. His symptoms worse in the evening. He has intermittent reflux symptoms. I doubt he has strep throat or other viral etiology. Trial of protonix 40 mg once daily. Reassess in one month. If persistent symptoms, consider ENT evaluation.

## 2013-05-21 ENCOUNTER — Ambulatory Visit (INDEPENDENT_AMBULATORY_CARE_PROVIDER_SITE_OTHER): Payer: BC Managed Care – PPO | Admitting: Family Medicine

## 2013-05-21 ENCOUNTER — Encounter: Payer: Self-pay | Admitting: Family Medicine

## 2013-05-21 VITALS — BP 100/78 | HR 71 | Temp 98.6°F | Wt 181.0 lb

## 2013-05-21 DIAGNOSIS — J069 Acute upper respiratory infection, unspecified: Secondary | ICD-10-CM

## 2013-05-21 MED ORDER — HYDROCOD POLST-CHLORPHEN POLST 10-8 MG/5ML PO LQCR: 5.0000 mL | Freq: Two times a day (BID) | ORAL | Status: DC | PRN

## 2013-05-21 NOTE — Progress Notes (Signed)
Chief Complaint  Patient presents with  . Headache    sneezing, stuffy nose, body aches,s fatigue, congestion     HPI:  Cody Frank is a 43 yo M pt of Dr. Artist Pais  here for an acute visit for:  Sick: -started yesterday -symptoms per above and cough, sinus pain -denies: fevers, chills, SOB, NVD, influenzae or ebola exposure -tried: theraflu   ROS: See pertinent positives and negatives per HPI.  Past Medical History  Diagnosis Date  . Headache(784.0)   . History of low back pain 2004    associated with work injury  . Allergic rhinitis   . Hyperlipidemia   . GERD (gastroesophageal reflux disease)   . Eczema   . Low back pain syndrome   . Hoarseness   . School physical exam 10/24/2010    No past surgical history on file.  Family History  Problem Relation Age of Onset  . Other Neg Hx     father in his early 80's and mother in her early 74's no significant  medical history  . Other Neg Hx     denies family history of colon cancer, diabetes, coronary artery disease    History   Social History  . Marital Status: Married    Spouse Name: N/A    Number of Children: N/A  . Years of Education: N/A   Social History Main Topics  . Smoking status: Never Smoker   . Smokeless tobacco: Never Used  . Alcohol Use: No  . Drug Use: No  . Sexual Activity: None   Other Topics Concern  . 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 Use for eczema, Disp: 30 g, Rfl: 2;  fluticasone (FLONASE) 50 MCG/ACT nasal spray, Place 2 sprays into the nose daily., Disp: 16 g, Rfl: 3;  pantoprazole (PROTONIX) 40 MG tablet, Take 1 tablet (40 mg total) by mouth daily., Disp: 30 tablet, Rfl: 3 pramoxine-hydrocortisone  (ANALPRAM HC) cream, Apply topically 3 (three) times daily., Disp: 30 g, Rfl: 0;  chlorpheniramine-HYDROcodone (TUSSIONEX PENNKINETIC ER) 10-8 MG/5ML LQCR, Take 5 mLs by mouth every 12 (twelve) hours as needed for cough., Disp: 115 mL, Rfl: 0  EXAM:  Filed Vitals:   05/21/13 1300  BP: 100/78  Pulse: 71  Temp: 98.6 F (37 C)    Body mass index is 26.72 kg/(m^2).  GENERAL: vitals reviewed and listed above, alert, oriented, appears well hydrated and in no acute distress  HEENT: atraumatic, conjunttiva clear, no obvious abnormalities on inspection of external nose and ears, normal appearance of ear canals and TMs, clear nasal congestion, mild post oropharyngeal erythema with PND, no tonsillar edema or exudate, no sinus TTP  NECK: no obvious masses on inspection  LUNGS: clear to auscultation bilaterally, no wheezes, rales or rhonchi, good air movement  CV: HRRR, no peripheral edema  MS: moves all extremities without noticeable abnormality  PSYCH: pleasant and cooperative, no obvious depression or anxiety  ASSESSMENT AND PLAN:  Discussed the following assessment and plan:  Upper respiratory infection - Plan: chlorpheniramine-HYDROcodone (TUSSIONEX PENNKINETIC ER) 10-8 MG/5ML LQCR -we discussed possible serious and likely etiologies, workup and treatment, treatment risks and return precautions - VURI  likely -after this discussion, Valgene opted for cough medication, supportive care per below -of course, we advised Bard  to return or notify a doctor immediately if symptoms worsen or persist or new concerns arise.  -Patient advised to return or notify a doctor immediately if symptoms worsen or persist or new concerns arise.  Patient Instructions  INSTRUCTIONS FOR UPPER RESPIRATORY INFECTION:  -plenty of rest and fluids  -nasal saline wash 2-3 times daily (use prepackaged nasal saline or bottled/distilled water if making your own)   -can use afrin or sinex nasal spray for  drainage and nasal congestion - but do NOT use longer then 3-4 days  -can use tylenol or ibuprofen as directed for aches and sorethroat  -in the winter time, using a humidifier at night is helpful (please follow cleaning instructions)  -if you are taking a cough medication - use only as directed, may also try a teaspoon of honey to coat the throat and throat lozenges  -for sore throat, salt water gargles can help  -follow up if you have fevers, facial pain, tooth pain, difficulty breathing or are worsening or not getting better in 5-7 days      Santanna Whitford R.

## 2013-05-21 NOTE — Progress Notes (Signed)
Pre visit review using our clinic review tool, if applicable. No additional management support is needed unless otherwise documented below in the visit note. 

## 2013-05-21 NOTE — Patient Instructions (Signed)
INSTRUCTIONS FOR UPPER RESPIRATORY INFECTION:  -plenty of rest and fluids  -nasal saline wash 2-3 times daily (use prepackaged nasal saline or bottled/distilled water if making your own)   -can use afrin or sinex nasal spray for drainage and nasal congestion - but do NOT use longer then 3-4 days  -can use tylenol or ibuprofen as directed for aches and sorethroat  -in the winter time, using a humidifier at night is helpful (please follow cleaning instructions)  -if you are taking a cough medication - use only as directed, may also try a teaspoon of honey to coat the throat and throat lozenges  -for sore throat, salt water gargles can help  -follow up if you have fevers, facial pain, tooth pain, difficulty breathing or are worsening or not getting better in 5-7 days  

## 2013-07-05 ENCOUNTER — Other Ambulatory Visit: Payer: Self-pay | Admitting: Internal Medicine

## 2014-01-20 ENCOUNTER — Ambulatory Visit: Payer: BC Managed Care – PPO | Admitting: Internal Medicine

## 2014-01-28 ENCOUNTER — Other Ambulatory Visit: Payer: Self-pay | Admitting: Internal Medicine

## 2014-01-31 ENCOUNTER — Encounter: Payer: Self-pay | Admitting: Internal Medicine

## 2014-01-31 ENCOUNTER — Ambulatory Visit (INDEPENDENT_AMBULATORY_CARE_PROVIDER_SITE_OTHER): Payer: BC Managed Care – PPO | Admitting: Internal Medicine

## 2014-01-31 VITALS — BP 112/68 | HR 58 | Temp 97.7°F | Wt 178.1 lb

## 2014-01-31 DIAGNOSIS — R079 Chest pain, unspecified: Secondary | ICD-10-CM

## 2014-01-31 MED ORDER — OMEPRAZOLE 40 MG PO CPDR: 40.0000 mg | DELAYED_RELEASE_CAPSULE | Freq: Every day | ORAL | Status: DC

## 2014-01-31 NOTE — Progress Notes (Signed)
Subjective:    Patient ID: Cody Frank, male    DOB: 06-Sep-1970, 43 y.o.   MRN: [id number]  DOS:  01/31/2014 Type of visit - description : ER followup Interval history: seen at the ER  01/23/2014 with a 2 day history of chest pain: Chest pain started at the  right upper chest, then moved to the center of the chest. It  was on and off, usually associated with lying down or moving his arms. No change w/ exertion or eating. He did endorse heartburn prior to the onset of symptoms . Workup at the ER it is reviewed: Hemoglobin 13.1, CMP okay, chest x-ray negative, EKG negative, troponin x3 minimally elevated.  The patient was told his symptoms were likely GERD related and he was discharged home. Is taking PPIs, symptoms have definitely decreased, he is chest pain-free at the time of this visit   ROS Denies fever chills No nausea, vomiting, diarrhea. No blood in the stool or stomach pain No dysphagia or odynophagia  Past Medical History  Diagnosis Date  . Headache(784.0)   . History of low back pain 2004    associated with work injury  . Allergic rhinitis   . Hyperlipidemia   . GERD (gastroesophageal reflux disease)   . Eczema   . Low back pain syndrome   . Hoarseness   . School physical exam 10/24/2010    History reviewed. No pertinent past surgical history.  History   Social History  . Marital Status: Married    Spouse Name: N/A    Number of Children: N/A  . Years of Education: N/A   Occupational History  . Not on file.   Social History Main Topics  . Smoking status: Never Smoker   . Smokeless tobacco: Never Used  . Alcohol Use: No  . Drug Use: No  . Sexual Activity: Not on file   Other Topics Concern  . 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  Always use your most recent med list.               clobetasol cream 0.05 %  Commonly known as:  TEMOVATE  Apply topically 2 (two) times daily. Use for eczema     pramoxine-hydrocortisone 1-1 % rectal cream  Commonly known as:  PROCTOCREAM-HC  APPLY TOPICALLY THREE TIMES DAILY           Objective:   Physical Exam BP 112/68  Pulse 58  Temp(Src) 97.7 F (36.5 C) (Oral)  Wt 178 lb 2 oz (80.797 kg)  SpO2 96% General -- alert, well-developed, NAD.   HEENT-- Not pale. Lungs -- normal respiratory effort, no intercostal retractions, no accessory muscle use, and normal breath sounds.  Heart-- normal rate, regular rhythm, no murmur.  Abdomen-- Not distended, good bowel sounds,soft, non-tender. No rebound or rigidity.  Extremities-- no pretibial edema bilaterally  Neurologic--  alert & oriented X3. Speech normal, gait appropriate for age, strength symmetric and appropriate for age. Psych-- Cognition and judgment appear intact. Cooperative with normal attention span and concentration. No anxious or depressed appearing.     Assessment &  Plan:   Chest pain, Atypical chest pain, workup at the ER showed positive troponins but pt told it was likely GERD (no MD notes found in the chart ). In the context of his general presentation I agree that sx are very unlikely to be heart related . At this point he's doing better on PPIs Plan: Continue with PPIs Follow up in 2 months Call sooner if symptoms resurface

## 2014-01-31 NOTE — Patient Instructions (Signed)
Take omeprazole one tablet every day before breakfast on an empty stomach Call if you have severe chest pain or persistent chest pain  Next visit in 2 months     Food Choices for Gastroesophageal Reflux Disease When you have gastroesophageal reflux disease (GERD), the foods you eat and your eating habits are very important. Choosing the right foods can help ease the discomfort of GERD. WHAT GENERAL GUIDELINES DO I NEED TO FOLLOW?  Choose fruits, vegetables, whole grains, low-fat dairy products, and low-fat meat, fish, and poultry.  Limit fats such as oils, salad dressings, butter, nuts, and avocado.  Keep a food diary to identify foods that cause symptoms.  Avoid foods that cause reflux. These may be different for different people.  Eat frequent small meals instead of three large meals each day.  Eat your meals slowly, in a relaxed setting.  Limit fried foods.  Cook foods using methods other than frying.  Avoid drinking alcohol.  Avoid drinking large amounts of liquids with your meals.  Avoid bending over or lying down until 2-3 hours after eating. WHAT FOODS ARE NOT RECOMMENDED? The following are some foods and drinks that may worsen your symptoms: Vegetables Tomatoes. Tomato juice. Tomato and spaghetti sauce. Chili peppers. Onion and garlic. Horseradish. Fruits Oranges, grapefruit, and lemon (fruit and juice). Meats High-fat meats, fish, and poultry. This includes hot dogs, ribs, ham, sausage, salami, and bacon. Dairy Whole milk and chocolate milk. Sour cream. Cream. Butter. Ice cream. Cream cheese.  Beverages Coffee and tea, with or without caffeine. Carbonated beverages or energy drinks. Condiments Hot sauce. Barbecue sauce.  Sweets/Desserts Chocolate and cocoa. Donuts. Peppermint and spearmint. Fats and Oils High-fat foods, including [place] fries and potato chips. Other Vinegar. Strong spices, such as black pepper, white pepper, red pepper, cayenne, curry  powder, cloves, ginger, and chili powder. The items listed above may not be a complete list of foods and beverages to avoid. Contact your dietitian for more information. Document Released: 04/22/2005 Document Revised: 04/27/2013 Document Reviewed: 02/24/2013 [facility] Patient Information 2015 [place], [place]. This information is not intended to replace advice given to you by your health care provider. Make sure you discuss any questions you have with your health care provider.

## 2014-01-31 NOTE — Progress Notes (Signed)
Pre visit review using our clinic review tool, if applicable. No additional management support is needed unless otherwise documented below in the visit note. 

## 2014-04-04 ENCOUNTER — Other Ambulatory Visit: Payer: Self-pay

## 2014-04-26 ENCOUNTER — Encounter: Payer: Self-pay | Admitting: Internal Medicine

## 2014-04-26 ENCOUNTER — Ambulatory Visit (INDEPENDENT_AMBULATORY_CARE_PROVIDER_SITE_OTHER): Payer: BC Managed Care – PPO | Admitting: Internal Medicine

## 2014-04-26 VITALS — BP 120/79 | HR 58 | Temp 97.9°F | Ht 69.0 in | Wt 175.2 lb

## 2014-04-26 DIAGNOSIS — E785 Hyperlipidemia, unspecified: Secondary | ICD-10-CM

## 2014-04-26 DIAGNOSIS — D649 Anemia, unspecified: Secondary | ICD-10-CM

## 2014-04-26 DIAGNOSIS — K219 Gastro-esophageal reflux disease without esophagitis: Secondary | ICD-10-CM

## 2014-04-26 DIAGNOSIS — G44209 Tension-type headache, unspecified, not intractable: Secondary | ICD-10-CM

## 2014-04-26 DIAGNOSIS — L290 Pruritus ani: Secondary | ICD-10-CM

## 2014-04-26 DIAGNOSIS — L309 Dermatitis, unspecified: Secondary | ICD-10-CM

## 2014-04-26 MED ORDER — HYDROCORTISONE ACE-PRAMOXINE 1-1 % RE CREA: TOPICAL_CREAM | RECTAL | Status: DC

## 2014-04-26 MED ORDER — PANTOPRAZOLE SODIUM 40 MG PO TBEC: 40.0000 mg | DELAYED_RELEASE_TABLET | Freq: Every day | ORAL | Status: DC

## 2014-04-26 MED ORDER — CLOBETASOL PROPIONATE 0.05 % EX CREA: TOPICAL_CREAM | Freq: Two times a day (BID) | CUTANEOUS | Status: DC

## 2014-04-26 NOTE — Progress Notes (Signed)
Subjective:    Patient ID: Cody Frank, male    DOB: 04-Oct-1970, 43 y.o.   MRN: [id number]  DOS:  04/26/2014 Type of visit - description : New patient to me, transferring from Dr. Artist PaisYoo ( previous visit with me was acute only) interval history: GERD, on PPIs, he symptoms has decreased significantly. Many years history of headaches, on-off,  random events, may last few hours, he has not missed work due to headaches, not associated with nausea or vomiting. Could be located at the forehead or be global. History of hemorrhoids, request a refill on the medications.   ROS Denies fever or chills. No sinus pain or congestion although he occasionally has nasal discharge. Has itchy dry skin at the  hands and legs in a symmetric fashion  Past Medical History  Diagnosis Date  . Headache(784.0)   . History of low back pain 2004    associated with work injury  . Allergic rhinitis   . Hyperlipidemia   . GERD (gastroesophageal reflux disease)   . Eczema   . Low back pain syndrome   . Hoarseness   . School physical exam 10/24/2010    Past Surgical History  Procedure Laterality Date  . No past surgeries      History   Social History  . Marital Status: Married    Spouse Name: N/A    Number of Children: 4  . Years of Education: N/A   Occupational History  . active job    Social History Main Topics  . Smoking status: Never Smoker   . Smokeless tobacco: Never Used  . Alcohol Use: No  . Drug Use: No  . Sexual Activity: Not on file   Other Topics Concern  . Not on file   Social History Narrative   Married with 4 children    [place]         Family History  Problem Relation Age of Onset  . CAD Neg Hx   . Diabetes Neg Hx   . Colon cancer Neg Hx   . Prostate cancer Neg Hx        Medication List       This list is accurate as of: 04/26/14 11:59 PM.  Always use your most recent med list.               clobetasol cream 0.05 %  Commonly  known as:  TEMOVATE  Apply topically 2 (two) times daily. Use for eczema     pantoprazole 40 MG tablet  Commonly known as:  PROTONIX  Take 1 tablet (40 mg total) by mouth daily.     pramoxine-hydrocortisone 1-1 % rectal cream  Commonly known as:  PROCTOCREAM-HC  APPLY TOPICALLY THREE TIMES DAILY           Objective:   Physical Exam  Skin:      BP 120/79 mmHg  Pulse 58  Temp(Src) 97.9 F (36.6 C) (Oral)  Ht 5\' 9"  (1.753 m)  Wt 175 lb 4 oz (79.493 kg)  BMI 25.87 kg/m2  SpO2 97% General -- alert, well-developed, NAD.  HEENT-- Not pale.   Lungs -- normal respiratory effort, no intercostal retractions, no accessory muscle use, and normal breath sounds.  Heart-- normal rate, regular rhythm, no murmur.   Extremities-- no pretibial edema bilaterally  Neurologic--  alert & oriented X3. Speech normal, gait appropriate for age, strength symmetric and appropriate for age.  DTRs symmetrically decreased. EOMI, PERLA   Psych--  Cognition and judgment appear intact. Cooperative with normal attention span and concentration. No anxious or depressed appearing.     Assessment & Plan:

## 2014-04-26 NOTE — Patient Instructions (Signed)
Get your blood work before you leave   For the eczema, use OTC hydrocortisone cream 1%   Please come back to the office in 6 months for a physical exam. Come back fasting

## 2014-04-26 NOTE — Progress Notes (Signed)
Pre visit review using our clinic review tool, if applicable. No additional management support is needed unless otherwise documented below in the visit note. 

## 2014-04-27 NOTE — Assessment & Plan Note (Signed)
Likely due to hemorrhoids, refill creams as needed

## 2014-04-27 NOTE — Assessment & Plan Note (Signed)
Headache, on and off for years. On chart review had a negative CT in 2002 performed due to headaches. Symptoms respond to Excedrin. For now I recommend to continue Excedrin. He has hyperreflexia, will check a TSH

## 2014-04-27 NOTE — Assessment & Plan Note (Signed)
GERD, continue with PPIs, doing great, okay to take as needed

## 2014-04-27 NOTE — Assessment & Plan Note (Signed)
Hyperlipidemia, diet discussed, check FLP and CMP

## 2014-04-27 NOTE — Assessment & Plan Note (Signed)
Anemia, per chart review hemoglobin was low at some point. Will check a cbc and iron - ferritin as well

## 2014-04-27 NOTE — Assessment & Plan Note (Signed)
Rash in the symmetric fashion, Eczema? Recommend OTC hydrocortisone

## 2014-05-03 ENCOUNTER — Other Ambulatory Visit: Payer: BC Managed Care – PPO

## 2014-05-03 DIAGNOSIS — D649 Anemia, unspecified: Secondary | ICD-10-CM | POA: Diagnosis not present

## 2014-05-03 DIAGNOSIS — G44209 Tension-type headache, unspecified, not intractable: Secondary | ICD-10-CM | POA: Diagnosis not present

## 2014-05-03 DIAGNOSIS — E785 Hyperlipidemia, unspecified: Secondary | ICD-10-CM | POA: Diagnosis not present

## 2014-05-03 LAB — COMPREHENSIVE METABOLIC PANEL
ALT: 18 U/L (ref 0–53)
AST: 20 U/L (ref 0–37)
Albumin: 4 g/dL (ref 3.5–5.2)
Alkaline Phosphatase: 58 U/L (ref 39–117)
BUN: 14 mg/dL (ref 6–23)
CALCIUM: 8.9 mg/dL (ref 8.4–10.5)
CHLORIDE: 107 meq/L (ref 96–112)
CO2: 26 mEq/L (ref 19–32)
CREATININE: 0.9 mg/dL (ref 0.4–1.5)
GFR: 115.06 mL/min (ref >60.00)
Glucose, Bld: 110 mg/dL — ABNORMAL HIGH (ref 70–99)
POTASSIUM: 3.9 meq/L (ref 3.5–5.1)
Sodium: 139 mEq/L (ref 135–145)
TOTAL PROTEIN: 7.3 g/dL (ref 6.0–8.3)
Total Bilirubin: 0.6 mg/dL (ref 0.2–1.2)

## 2014-05-03 LAB — CBC WITH DIFFERENTIAL/PLATELET
BASOS PCT: 0.5 % (ref 0.0–3.0)
Basophils Absolute: 0 10*3/uL (ref 0.0–0.1)
Eosinophils Absolute: 0.2 10*3/uL (ref 0.0–0.7)
Eosinophils Relative: 5.1 % — ABNORMAL HIGH (ref 0.0–5.0)
HCT: 40.6 % (ref 39.0–52.0)
Hemoglobin: 12.6 g/dL — ABNORMAL LOW (ref 13.0–17.0)
Lymphocytes Relative: 37.2 % (ref 12.0–46.0)
Lymphs Abs: 1.7 10*3/uL (ref 0.7–4.0)
MCHC: 31 g/dL (ref 30.0–36.0)
MCV: 72.5 fl — AB (ref 78.0–100.0)
MONO ABS: 0.3 10*3/uL (ref 0.1–1.0)
MONOS PCT: 6.5 % (ref 3.0–12.0)
NEUTROS PCT: 50.7 % (ref 43.0–77.0)
Neutro Abs: 2.3 10*3/uL (ref 1.4–7.7)
PLATELETS: 248 10*3/uL (ref 150.0–400.0)
RBC: 5.6 Mil/uL (ref 4.22–5.81)
RDW: 13.6 % (ref 11.5–15.5)
WBC: 4.6 10*3/uL (ref 4.0–10.5)

## 2014-05-03 LAB — LIPID PANEL
Cholesterol: 252 mg/dL — ABNORMAL HIGH (ref 0–200)
HDL: 45.6 mg/dL (ref >39.00)
NonHDL: 206.4
TRIGLYCERIDES: 274 mg/dL — AB (ref 0.0–149.0)
Total CHOL/HDL Ratio: 6
VLDL: 54.8 mg/dL — ABNORMAL HIGH (ref 0.0–40.0)

## 2014-05-03 LAB — TSH: TSH: 3.49 u[IU]/mL (ref 0.35–4.50)

## 2014-05-03 LAB — LDL CHOLESTEROL, DIRECT: LDL DIRECT: 136 mg/dL

## 2014-05-03 LAB — FERRITIN: Ferritin: 92.3 ng/mL (ref 22.0–322.0)

## 2014-05-03 LAB — IRON: Iron: 58 ug/dL (ref 42–165)

## 2014-09-07 ENCOUNTER — Ambulatory Visit (INDEPENDENT_AMBULATORY_CARE_PROVIDER_SITE_OTHER): Payer: BLUE CROSS/BLUE SHIELD | Admitting: Medical

## 2014-09-07 ENCOUNTER — Encounter: Payer: Self-pay | Admitting: Medical

## 2014-09-07 VITALS — BP 124/80 | HR 77 | Temp 98.1°F | Ht 69.0 in | Wt 177.8 lb

## 2014-09-07 DIAGNOSIS — J029 Acute pharyngitis, unspecified: Secondary | ICD-10-CM | POA: Diagnosis not present

## 2014-09-07 LAB — POCT RAPID STREP A (OFFICE): Rapid Strep A Screen: NEGATIVE

## 2014-09-07 MED ORDER — MOMETASONE FUROATE 50 MCG/ACT NA SUSP
NASAL | Status: DC
Start: 2014-09-07 — End: 2015-12-08

## 2014-09-07 MED ORDER — AZITHROMYCIN 250 MG PO TABS: ORAL_TABLET | ORAL | Status: DC

## 2014-09-07 NOTE — Addendum Note (Signed)
Addended by: Lurline HareARTER, Oaklyn Mans E on: 09/07/2014 03:14 PM   Modules accepted: Orders

## 2014-09-07 NOTE — Progress Notes (Signed)
Subjective:    Patient ID: Cody Frank, male    DOB: February 09, 1971, 44 y.o.   MRN: [id number]  HPI  Pt in with sore throat. Pain is moderate to severe. Worse yesterday. Painful for 4 days. No fevers, no chills or sweats. Moderate fatigue. No body aches. No family of friends sick.   Pt has history of allergies. Some sneezing recently. Improves with rains. Pt takes some zyrtec. Pt was using steroid inhaler in the past.     Review of Systems  Constitutional: Negative for fever, chills and fatigue.  HENT: Positive for congestion and sore throat. Negative for ear discharge, ear pain and postnasal drip.   Eyes: Positive for itching.  Respiratory: Negative for cough, chest tightness and wheezing.   Cardiovascular: Negative for chest pain and palpitations.  Musculoskeletal: Negative for back pain.  Neurological: Negative for dizziness, syncope, speech difficulty, weakness, numbness and headaches.  Hematological: Negative for adenopathy. Does not bruise/bleed easily.  Psychiatric/Behavioral: Negative for behavioral problems and confusion.    Past Medical History  Diagnosis Date  . Headache(784.0)   . History of low back pain 2004    associated with work injury  . Allergic rhinitis   . Hyperlipidemia   . GERD (gastroesophageal reflux disease)   . Eczema   . Low back pain syndrome   . Hoarseness   . School physical exam 10/24/2010    History   Social History  . Marital Status: Married    Spouse Name: N/A  . Number of Children: 4  . Years of Education: N/A   Occupational History  . active job    Social History Main Topics  . Smoking status: Never Smoker   . Smokeless tobacco: Never Used  . Alcohol Use: No  . Drug Use: No  . Sexual Activity: Not on file   Other Topics Concern  . Not on file   Social History Narrative   Married with 4 children    He is originally from [place]        Past Surgical History  Procedure Laterality Date  . No past surgeries       Family History  Problem Relation Age of Onset  . CAD Neg Hx   . Diabetes Neg Hx   . Colon cancer Neg Hx   . Prostate cancer Neg Hx     Allergies  Allergen Reactions  . Amoxicillin     vomitting    Current Outpatient Prescriptions on File Prior to Visit  Medication Sig Dispense Refill  . clobetasol cream (TEMOVATE) 0.05 % Apply topically 2 (two) times daily. Use for eczema 30 g 3  . pantoprazole (PROTONIX) 40 MG tablet Take 1 tablet (40 mg total) by mouth daily. 30 tablet 3  . pramoxine-hydrocortisone (PROCTOCREAM-HC) 1-1 % rectal cream APPLY TOPICALLY THREE TIMES DAILY 30 g 3   No current facility-administered medications on file prior to visit.    BP 124/80 mmHg  Pulse 77  Temp(Src) 98.1 F (36.7 C) (Oral)  Ht 5\' 9"  (1.753 m)  Wt 177 lb 12.8 oz (80.65 kg)  BMI 26.24 kg/m2  SpO2 98%       Objective:   Physical Exam   General  Mental Status - Alert. General Appearance - Well groomed. Not in acute distress.  Skin Rashes- No Rashes.  HEENT Head- Normal. Ear Auditory Canal - Left- Normal. Right - Normal.Tympanic Membrane- Left- Normal. Right- Normal. Eye Sclera/Conjunctiva- Left- Normal. Right- Normal. Nose & Sinuses Nasal Mucosa- Left-  Boggy  and Congested. Right-  Boggy and  Congested.Bilateral no  maxillary and  No frontal sinus pressure. Mouth & Throat Lips: Upper Lip- Normal: no dryness, cracking, pallor, cyanosis, or vesicular eruption. Lower Lip-Normal: no dryness, cracking, pallor, cyanosis or vesicular eruption. Buccal Mucosa- Bilateral- No Aphthous ulcers. Oropharynx- No Discharge or Erythema. Tonsils: Characteristics- Bilateral- Moderte  Erythema and  Congestion. Size/Enlargement- Bilateral- 1+ enlargement. Discharge- bilateral-None.  Neck Neck- Supple. No Masses.mild enlarged submandibular lymph nodes.  Chest and Lung Exam Auscultation: Breath Sounds:-Clear even and unlabored.  Cardiovascular Auscultation:Rythm- Regular, rate and  rhythm. Murmurs & Other Heart Sounds:Ausculatation of the heart reveal- No Murmurs.  Lymphatic Head & Neck General Head & Neck Lymphatics: Bilateral: Description- No Localized lymphadenopathy.      Assessment & Plan:

## 2014-09-07 NOTE — Assessment & Plan Note (Signed)
Some chronic allergies. Continue zyrtec and rx nasonex.

## 2014-09-07 NOTE — Progress Notes (Signed)
Pre visit review using our clinic review tool, if applicable. No additional management support is needed unless otherwise documented below in the visit note. 

## 2014-09-07 NOTE — Patient Instructions (Addendum)
Acute pharyngitis With your level of throat pain and by exam, I think best to treat with antibiotic. This does not appear to be throat pain consistent with only allergic pnd.  Rx azithrmocyin.   ALLERGIC RHINITIS Some chronic allergies. Continue zyrtec and rx nasonex.     Follow up in 7 days or as needed.  Regarding your upcoming school requirements. I would schedule a wellness exam for this friday. Try to get old records if you have at home. YOu appear to need varicella vaccine records. Hep b records and TB skin test records. Limited records in our system. PPD not recent.  Pt was late for his appointment today. I agreed to see him then he had questions about form and physical.

## 2014-09-07 NOTE — Assessment & Plan Note (Addendum)
With your level of throat pain and by exam, I think best to treat with antibiotic. This does not appear to be throat pain consistent with only allergic pnd.  Rx azithrmocyin.  Rapid test for strep neg. But may be false negative. Will treat clinically.

## 2014-09-09 ENCOUNTER — Ambulatory Visit (INDEPENDENT_AMBULATORY_CARE_PROVIDER_SITE_OTHER): Payer: BLUE CROSS/BLUE SHIELD | Admitting: Internal Medicine

## 2014-09-09 ENCOUNTER — Encounter: Payer: Self-pay | Admitting: Internal Medicine

## 2014-09-09 VITALS — BP 122/80 | HR 75 | Temp 98.0°F | Ht 69.0 in | Wt 172.4 lb

## 2014-09-09 DIAGNOSIS — Z111 Encounter for screening for respiratory tuberculosis: Secondary | ICD-10-CM

## 2014-09-09 DIAGNOSIS — K219 Gastro-esophageal reflux disease without esophagitis: Secondary | ICD-10-CM | POA: Diagnosis not present

## 2014-09-09 DIAGNOSIS — Z23 Encounter for immunization: Secondary | ICD-10-CM | POA: Diagnosis not present

## 2014-09-09 DIAGNOSIS — Z020 Encounter for examination for admission to educational institution: Secondary | ICD-10-CM

## 2014-09-09 DIAGNOSIS — J029 Acute pharyngitis, unspecified: Secondary | ICD-10-CM | POA: Diagnosis not present

## 2014-09-09 DIAGNOSIS — L309 Dermatitis, unspecified: Secondary | ICD-10-CM

## 2014-09-09 DIAGNOSIS — Z0289 Encounter for other administrative examinations: Secondary | ICD-10-CM

## 2014-09-09 DIAGNOSIS — K641 Second degree hemorrhoids: Secondary | ICD-10-CM

## 2014-09-09 MED ORDER — HYDROCORTISONE ACE-PRAMOXINE 1-1 % RE CREA: TOPICAL_CREAM | Freq: Three times a day (TID) | RECTAL | Status: DC

## 2014-09-09 MED ORDER — CLOBETASOL PROPIONATE 0.05 % EX CREA
TOPICAL_CREAM | Freq: Two times a day (BID) | CUTANEOUS | Status: DC
Start: 2014-09-09 — End: 2015-12-08

## 2014-09-09 MED ORDER — PANTOPRAZOLE SODIUM 40 MG PO TBEC: 40.0000 mg | DELAYED_RELEASE_TABLET | Freq: Every day | ORAL | Status: DC

## 2014-09-09 NOTE — Assessment & Plan Note (Addendum)
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

## 2014-09-09 NOTE — Progress Notes (Signed)
Subjective:    Patient ID: Cody Frank, male    DOB: Jul 15, 1970, 44 y.o.   MRN: [id number]  DOS:  09/09/2014 Type of visit - description : f/u Interval history: Recently seen with pharyngitis, on antibiotics, feeling better. Request a refill on PPIs, symptoms well-controlled Requests several tests for his employer. See assessment and plan Was treated with topical steroids for provisional diagnosis of eczema, not better,. Continue with episodic anal itching.     Review of Systems  denies chest pain or difficulty breathing No blood in the stools or abdominal pain.   Past Medical History  Diagnosis Date  . Headache(784.0)   . History of low back pain 2004    associated with work injury  . Allergic rhinitis   . Hyperlipidemia   . GERD (gastroesophageal reflux disease)   . Eczema   . Low back pain syndrome   . Hoarseness   . School physical exam 10/24/2010    Past Surgical History  Procedure Laterality Date  . No past surgeries      History   Social History  . Marital Status: Married    Spouse Name: N/A  . Number of Children: 4  . Years of Education: N/A   Occupational History  . active job    Social History Main Topics  . Smoking status: Never Smoker   . Smokeless tobacco: Never Used  . Alcohol Use: No  . Drug Use: No  . Sexual Activity: Not on file   Other Topics Concern  . Not on file   Social History Narrative   Married with 4 children    He is originally from [place]            Medication List       This list is accurate as of: 09/09/14 11:59 PM.  Always use your most recent med list.               azithromycin 250 MG tablet  Commonly known as:  ZITHROMAX  Take 2 tablets by mouth on day 1, followed by 1 tablet by mouth daily for 4 days.     clobetasol cream 0.05 %  Commonly known as:  TEMOVATE  Apply topically 2 (two) times daily. Use for eczema     mometasone 50 MCG/ACT nasal spray  Commonly known as:  NASONEX  2 sprays each  nares q day     pantoprazole 40 MG tablet  Commonly known as:  PROTONIX  Take 1 tablet (40 mg total) by mouth daily.     pramoxine-hydrocortisone 1-1 % rectal cream  Commonly known as:  PROCTOCREAM-HC  Place rectally 3 (three) times daily.           Objective:   Physical Exam  Skin:      BP 122/80 mmHg  Pulse 75  Temp(Src) 98 F (36.7 C) (Oral)  Ht 5\' 9"  (1.753 m)  Wt 172 lb 6 oz (78.189 kg)  BMI 25.44 kg/m2  SpO2 97%  General:   Well developed, well nourished . NAD.  HEENT:  Normocephalic . Face symmetric, atraumatic Rectal:  External abnormalities: none. Normal sphincter tone. No rectal masses or tenderness.  Stool brown  Prostate: Prostate gland firm and smooth, no enlargement, nodularity, tenderness, mass, asymmetry or induration.  ANOSCOPY: Several small internal hemorrhoids without active bleeding Neurologic:  alert & oriented X3.  Speech normal, gait appropriate for age and unassisted Psych--  Cognition and judgment appear intact.  Cooperative with normal attention span and  concentration.  Behavior appropriate. No anxious or depressed appearing.       Assessment & Plan:     Today , I spent more than 25   min with the patient: >50% of the time counseling regards  his immunizations, hemorrhoids, multiple questions answered. Also reviewing his chart, including the fact that he had a positive PPD in the past and he had previous titers done

## 2014-09-09 NOTE — Assessment & Plan Note (Signed)
Symptoms well controlled with PPIs, recommend to decrease to every other day.

## 2014-09-09 NOTE — Assessment & Plan Note (Signed)
Resolving

## 2014-09-09 NOTE — Assessment & Plan Note (Signed)
Not responding to steroids, refer to dermatology

## 2014-09-09 NOTE — Patient Instructions (Addendum)
Get your blood work before you leave    Hepatitis B #2 in one month Hepatitis be #3 in 6 months    Come back to the office  for a physical exam within the next 2-3 months

## 2014-09-09 NOTE — Assessment & Plan Note (Signed)
Continue with on and off ano- rectal itching, anoscopy today confirm hemorrhoids, refill Proctofoam cream

## 2014-09-09 NOTE — Progress Notes (Signed)
Pre visit review using our clinic review tool, if applicable. No additional management support is needed unless otherwise documented below in the visit note. 

## 2014-09-11 LAB — QUANTIFERON TB GOLD ASSAY (BLOOD)
Interferon Gamma Release Assay: NEGATIVE
Mitogen value: 7.77 IU/mL
Quantiferon Nil Value: 0.02 IU/mL
Quantiferon Tb Ag Minus Nil Value: 0.04 IU/mL
TB Ag value: 0.06 IU/mL

## 2014-09-15 ENCOUNTER — Encounter: Payer: Self-pay | Admitting: Internal Medicine

## 2014-09-16 ENCOUNTER — Telehealth: Payer: Self-pay | Admitting: Internal Medicine

## 2014-09-16 NOTE — Telephone Encounter (Signed)
FYI

## 2014-09-16 NOTE — Telephone Encounter (Signed)
Relation to pt: self  Call back number: [phone]   Reason for call:  Pt is currently in school requesting a physical appointment before August, pt states if MD does not have any available appointments can a PA conduct the physical. Please adivse

## 2014-09-16 NOTE — Telephone Encounter (Signed)
Schedule a cpx Put two 15 minute- appointment together for a physical. Overbook okay

## 2014-09-16 NOTE — Telephone Encounter (Signed)
Was physical completed on 09/09/2014?

## 2014-09-20 ENCOUNTER — Telehealth: Payer: Self-pay | Admitting: Internal Medicine

## 2014-09-20 NOTE — Telephone Encounter (Signed)
Pt scheduled physical for June 8th at 1pm

## 2014-09-20 NOTE — Telephone Encounter (Signed)
Pre Visit letter sent  °

## 2014-09-22 ENCOUNTER — Other Ambulatory Visit: Payer: Self-pay

## 2014-10-11 ENCOUNTER — Telehealth: Payer: Self-pay | Admitting: *Deleted

## 2014-10-11 NOTE — Telephone Encounter (Signed)
Unable to reach patient at time of Pre-Visit Call.  Left message for patient to return call when available.    

## 2014-10-12 ENCOUNTER — Encounter: Payer: Self-pay | Admitting: Internal Medicine

## 2014-10-12 ENCOUNTER — Ambulatory Visit (INDEPENDENT_AMBULATORY_CARE_PROVIDER_SITE_OTHER): Payer: BLUE CROSS/BLUE SHIELD | Admitting: Internal Medicine

## 2014-10-12 VITALS — BP 110/64 | HR 62 | Temp 97.7°F | Ht 69.0 in | Wt 174.1 lb

## 2014-10-12 DIAGNOSIS — R490 Dysphonia: Secondary | ICD-10-CM | POA: Diagnosis not present

## 2014-10-12 DIAGNOSIS — Z0000 Encounter for general adult medical examination without abnormal findings: Secondary | ICD-10-CM | POA: Diagnosis not present

## 2014-10-12 DIAGNOSIS — M545 Low back pain, unspecified: Secondary | ICD-10-CM

## 2014-10-12 DIAGNOSIS — Z23 Encounter for immunization: Secondary | ICD-10-CM

## 2014-10-12 DIAGNOSIS — G44209 Tension-type headache, unspecified, not intractable: Secondary | ICD-10-CM

## 2014-10-12 DIAGNOSIS — J312 Chronic pharyngitis: Secondary | ICD-10-CM

## 2014-10-12 LAB — HEMOGLOBIN A1C: HEMOGLOBIN A1C: 5.2 % (ref 4.6–6.5)

## 2014-10-12 LAB — LIPID PANEL
CHOLESTEROL: 233 mg/dL — AB (ref 0–200)
HDL: 50.6 mg/dL (ref >39.00)
LDL Cholesterol: 157 mg/dL — ABNORMAL HIGH (ref 0–99)
NonHDL: 182.4
TRIGLYCERIDES: 128 mg/dL (ref 0.0–149.0)
Total CHOL/HDL Ratio: 5
VLDL: 25.6 mg/dL (ref 0.0–40.0)

## 2014-10-12 MED ORDER — NABUMETONE 750 MG PO TABS: 750.0000 mg | ORAL_TABLET | Freq: Every day | ORAL | Status: DC | PRN

## 2014-10-12 NOTE — Assessment & Plan Note (Addendum)
Today he reports on and off hoarseness, symptoms decrease with PPIs, recommend to take PPIs consistently, if the hoarseness is not gone, she will need a ENT referral. The patient did decline a ENT referral today

## 2014-10-12 NOTE — Assessment & Plan Note (Signed)
Reports on and off back pain, going on for years, at some point was recommended surgery but he declined, request a prescription for nabumetone   which is provided. Advised him to take it with food to prevent GI irritation.

## 2014-10-12 NOTE — Assessment & Plan Note (Addendum)
Td 2011  Never had a cscope Diet -exercise discussed, has not improved in the last few months. Labs : FLP, HIV, A1c

## 2014-10-12 NOTE — Progress Notes (Signed)
Pre visit review using our clinic review tool, if applicable. No additional management support is needed unless otherwise documented below in the visit note. 

## 2014-10-12 NOTE — Progress Notes (Signed)
Subjective:    Patient ID: Cody Frank, male    DOB: 10/01/1970, 44 y.o.   MRN: [id number]  DOS:  10/12/2014 Type of visit - description : cpx Interval history: No major events since the last office visit   Review of Systems Constitutional: No fever. No chills. No unexplained wt changes. No unusual sweats  HEENT: No dental problems, no ear discharge, no facial swelling, no voice changes. No eye discharge, no eye  redness , no  intolerance to light  Occasional hoarseness, better when he takes Prilosec consistently  Respiratory: No wheezing , no  difficulty breathing. No cough , no mucus production  Cardiovascular: No CP, no leg swelling , no  Palpitations  GI: no nausea, no vomiting, no diarrhea , no  abdominal pain.  No blood in the stools. No dysphagia, no odynophagia    Endocrine: No polyphagia, no polyuria , no polydipsia  GU: No dysuria, gross hematuria, difficulty urinating. No urinary urgency, no frequency.  Musculoskeletal: No joint swellings, back pain on and off, not a new problem,  request a prescription for NSAIDs  Skin: No change in the color of the skin, palor , no  Rash. Has eczema, dermatology visit pending  Allergic, immunologic: No environmental allergies , no  food allergies  Neurological: No dizziness no  syncope.  Continue with headaches on and off.  No diplopia, no slurred, no slurred speech, no motor deficits, no facial  Numbness  Hematological: No enlarged lymph nodes, no easy bruising , no unusual bleedings  Psychiatry: No suicidal ideas, no hallucinations, no beavior problems, no confusion.  No unusual/severe anxiety, no depression    Past Medical History  Diagnosis Date  . Headache(784.0)   . History of low back pain 2004    associated with work injury  . Allergic rhinitis   . Hyperlipidemia   . GERD (gastroesophageal reflux disease)   . Eczema   . Low back pain syndrome   . Hoarseness     Past Surgical History  Procedure Laterality  Date  . No past surgeries      History   Social History  . Marital Status: Married    Spouse Name: N/A  . Number of Children: 4  . Years of Education: N/A   Occupational History  . active job    Social History Main Topics  . Smoking status: Never Smoker   . Smokeless tobacco: Never Used  . Alcohol Use: No  . Drug Use: No  . Sexual Activity: Not on file   Other Topics Concern  . Not on file   Social History Narrative   Married with 4 children    He is originally from [place]         Family History  Problem Relation Age of Onset  . CAD Neg Hx   . Diabetes Neg Hx   . Colon cancer Neg Hx   . Prostate cancer Neg Hx        Medication List       This list is accurate as of: 10/12/14 11:59 PM.  Always use your most recent med list.               clobetasol cream 0.05 %  Commonly known as:  TEMOVATE  Apply topically 2 (two) times daily. Use for eczema     mometasone 50 MCG/ACT nasal spray  Commonly known as:  NASONEX  2 sprays each nares q day     nabumetone 750 MG tablet  Commonly known as:  RELAFEN  Take 1 tablet (750 mg total) by mouth daily as needed.     pantoprazole 40 MG tablet  Commonly known as:  PROTONIX  Take 1 tablet (40 mg total) by mouth daily.     pramoxine-hydrocortisone 1-1 % rectal cream  Commonly known as:  PROCTOCREAM-HC  Place rectally 3 (three) times daily.           Objective:   Physical Exam BP 110/64 mmHg  Pulse 62  Temp(Src) 97.7 F (36.5 C) (Oral)  Ht  (1.753 m)  Wt 174 lb 2 oz (78.983 kg)  BMI 25.70 kg/m2  SpO2 99% General:   Well developed, well nourished . NAD.  Neck:  Full range of motion. Supple. No  thyromegaly , no LADs HEENT:  Normocephalic . Face symmetric, atraumatic Lungs:  CTA B Normal respiratory effort, no intercostal retractions, no accessory muscle use. Heart: RRR,  no murmur.  No pretibial edema bilaterally  Abdomen:  Not distended, soft, non-tender. No rebound or rigidity. No  mass,organomegaly Skin: Exposed areas without rash. Not pale. Not jaundice Neurologic:  alert & oriented X3.  Speech normal, gait appropriate for age and unassisted Strength symmetric and appropriate for age.  Psych: Cognition and judgment appear intact.  Cooperative with normal attention span and concentration.  Behavior appropriate. No anxious or depressed appearing.        Assessment & Plan:

## 2014-10-12 NOTE — Patient Instructions (Signed)
Get your blood work before you leave    

## 2014-10-12 NOTE — Assessment & Plan Note (Signed)
On and off headaches, symptoms of baseline, states that he will not take medication for it

## 2014-10-13 LAB — HIV ANTIBODY (ROUTINE TESTING W REFLEX): HIV: NONREACTIVE

## 2014-12-27 ENCOUNTER — Telehealth: Payer: Self-pay | Admitting: *Deleted

## 2014-12-27 DIAGNOSIS — Z0279 Encounter for issue of other medical certificate: Secondary | ICD-10-CM

## 2014-12-27 NOTE — Telephone Encounter (Signed)
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 The pt noted on his billing form that he needs this by tomorrow and this is not feasible since he needs bloodwork/PPD.   Pt needs titers for Rubella, Rubeola, Varicella, Mumps and Hepatitis B along with a PPD. Is it ok to place orders and have pt come in for a nurse visit? Please advise. JG//CMA

## 2014-12-28 ENCOUNTER — Ambulatory Visit (INDEPENDENT_AMBULATORY_CARE_PROVIDER_SITE_OTHER): Payer: BLUE CROSS/BLUE SHIELD | Admitting: Internal Medicine

## 2014-12-28 ENCOUNTER — Other Ambulatory Visit: Payer: Self-pay

## 2014-12-28 DIAGNOSIS — Z020 Encounter for examination for admission to educational institution: Secondary | ICD-10-CM

## 2014-12-28 DIAGNOSIS — Z0289 Encounter for other administrative examinations: Secondary | ICD-10-CM

## 2014-12-28 NOTE — Telephone Encounter (Addendum)
Please do, will be better to get a TB gold instead of PPD.

## 2014-12-28 NOTE — Telephone Encounter (Signed)
Spoke with pt. Nurse visit scheduled, lab orders placed as future. Forms given to [facility] for appt. JG//CMA

## 2014-12-28 NOTE — Progress Notes (Signed)
Pre visit review using our clinic review tool, if applicable. No additional management support is needed unless otherwise documented below in the visit note.  Patient presented in office today for vision test and labs to have paperwork for school completed.  Paperwork returned to [employer].

## 2014-12-28 NOTE — Telephone Encounter (Signed)
LM for pt to please return call.  He needs a nurse visit for vision check AND a lab appt for bloodwork (orders placed)  Pt has TB gold drawn May 2016 and will not need this, just the titers that are placed as future orders. JG//CMA

## 2014-12-29 LAB — HEPATITIS B CORE ANTIBODY, IGM: HEP B C IGM: NONREACTIVE

## 2014-12-29 NOTE — Progress Notes (Signed)
Patient came in for a nurse visits, labs normal. JP

## 2014-12-31 LAB — RUBEOLA ANTIBODY, IGM: Measles Ab IgM, IFA: <1:20 {titer}

## 2014-12-31 LAB — RUBELLA ANTIBODY, IGM: Rubella IgM: 0.43 (ref <0.91)

## 2014-12-31 LAB — MUMPS ANTIBODY, IGM: Mumps IgM Value: <1:20 {titer}

## 2015-01-02 ENCOUNTER — Telehealth: Payer: Self-pay | Admitting: Internal Medicine

## 2015-01-02 NOTE — Telephone Encounter (Signed)
Please advise 

## 2015-01-02 NOTE — Telephone Encounter (Signed)
Caller name: Relation to ZO:XWRU Call back number:[phone] Pharmacy:  Reason for call: pt is wanting to know if his lab results are back that were done last week, pt would like to come a pick up a copy.

## 2015-01-03 NOTE — Telephone Encounter (Signed)
Spoke with Pt, informed him that we are still waiting for Varicella/chicken pox results. Informed him that he still needs MMR series, and 1 final Hepatitis B in November to complete series. Also, informed him depending on Varicella results, he may need series. Pt verbalized that he needs paperwork immediately, I informed him I can give it to him but it would be incomplete due to pending Varicella results. Pt requested we call as soon as we receive Varicella results so he can schedule nurse visit to receive immunizations.

## 2015-01-03 NOTE — Telephone Encounter (Signed)
Per chart record, Pt had Varicella IgG in 10/2010 which was positive for immunity. Results attached to school paperwork and given to [employer]. Will call Pt and inform him all he is due for is an MMR series and 3rd Hep B shot in November 2016.

## 2015-01-03 NOTE — Telephone Encounter (Signed)
Informed by Onalee Hua in lab. [facility] never received blood for Varicella. Will inform Pt more blood needed or if he knows that he has not had chicken pox, he can receive vaccination.

## 2015-01-03 NOTE — Telephone Encounter (Signed)
Spoke with Pt, informed him that Varicella results are back and he is immune. Informed him that all he needs is the MMR series, and his last Hep B in November. Pt scheduled nurse visit for 01/04/2015 at 1000 for MMR and to pick up paperwork for school, paperwork still with [employer].

## 2015-01-03 NOTE — Telephone Encounter (Signed)
Labs reviewed, needs MMR.  Varicella titers pending Form completed, has a history of chronic back pain

## 2015-01-04 ENCOUNTER — Ambulatory Visit: Payer: BLUE CROSS/BLUE SHIELD

## 2015-01-04 DIAGNOSIS — Z23 Encounter for immunization: Secondary | ICD-10-CM

## 2015-01-04 MED ORDER — MEASLES, MUMPS & RUBELLA VAC ~~LOC~~ INJ: 0.5000 mL | INJECTION | Freq: Once | SUBCUTANEOUS | Status: DC

## 2015-04-04 ENCOUNTER — Telehealth: Payer: Self-pay | Admitting: Internal Medicine

## 2015-04-04 DIAGNOSIS — Z7184 Encounter for health counseling related to travel: Secondary | ICD-10-CM

## 2015-04-04 NOTE — Telephone Encounter (Signed)
Left message for pt to call and let [place] know where he will be travelling specifically.

## 2015-04-04 NOTE — Telephone Encounter (Signed)
Pt will be traveling out of the country and is asking if med can be prescribed for him to treat/prevent milaria. Please call [phone].

## 2015-04-05 NOTE — Telephone Encounter (Signed)
Patient returning your call, [place] [address] [place]

## 2015-04-05 NOTE — Telephone Encounter (Signed)
Please advise 

## 2015-04-05 NOTE — Telephone Encounter (Signed)
Recommend to go to the Travel  clinic at [facility], he needs comprehensive travel advise Their number is :  [phone]

## 2015-04-05 NOTE — Telephone Encounter (Signed)
Referral placed to Travel Clinic.

## 2015-04-07 ENCOUNTER — Encounter: Payer: BLUE CROSS/BLUE SHIELD | Admitting: Internal Medicine

## 2015-04-14 ENCOUNTER — Encounter: Payer: BLUE CROSS/BLUE SHIELD | Admitting: Internal Medicine

## 2015-11-26 ENCOUNTER — Other Ambulatory Visit: Payer: Self-pay | Admitting: Internal Medicine

## 2015-12-08 ENCOUNTER — Encounter: Payer: Self-pay | Admitting: Internal Medicine

## 2015-12-08 ENCOUNTER — Ambulatory Visit (INDEPENDENT_AMBULATORY_CARE_PROVIDER_SITE_OTHER): Payer: BLUE CROSS/BLUE SHIELD | Admitting: Internal Medicine

## 2015-12-08 VITALS — BP 108/70 | HR 57 | Temp 97.5°F | Resp 12 | Ht 69.0 in | Wt 174.1 lb

## 2015-12-08 DIAGNOSIS — M545 Low back pain: Secondary | ICD-10-CM

## 2015-12-08 MED ORDER — PREDNISONE 10 MG PO TABS: ORAL_TABLET | ORAL | 0 refills | Status: DC

## 2015-12-08 MED ORDER — NABUMETONE 750 MG PO TABS: 750.0000 mg | ORAL_TABLET | Freq: Every day | ORAL | 6 refills | Status: DC | PRN

## 2015-12-08 NOTE — Progress Notes (Signed)
Subjective:    Patient ID: Cody Frank, male    DOB: [birth year], 45 y.o.   MRN: [id number]  DOS:  12/08/2015 Type of visit - description : Acute visit Interval history:  Has many years history of back pain, the pain over the last year has been gradually increasing and definitely worse over the last 4 weeks: Located mostly at the left low back, no actual radiation but sometimes the anterior left leg feels is slightly numb. Right low back pain is mild to moderate. He used to take Relafen with mixed results. He called his neurosurgeon and needed a referral to be seen.  Review of Systems Denies fever chills No back injury No rash No bladder or bowel incontinence. No dysuria or gross hematuria  Past Medical History:  Diagnosis Date  . Allergic rhinitis   . Eczema   . GERD (gastroesophageal reflux disease)   . Headache(784.0)   . History of chicken pox    Titered on 10/22/2010  . Hoarseness   . Hyperlipidemia   . Low back pain syndrome     Past Surgical History:  Procedure Laterality Date  . NO PAST SURGERIES      Social History   Social History  . Marital status: Married    Spouse name: N/A  . Number of children: 4  . Years of education: N/A   Occupational History  . active job    Social History Main Topics  . Smoking status: Never Smoker  . Smokeless tobacco: Never Used  . Alcohol use No  . Drug use: No  . Sexual activity: Not on file   Other Topics Concern  . Not on file   Social History Narrative   Married with 4 children    He is originally from [place]            Medication List       Accurate as of 12/08/15 11:59 PM. Always use your most recent med list.          nabumetone 750 MG tablet Commonly known as:  RELAFEN Take 1 tablet (750 mg total) by mouth daily as needed.   predniSONE 10 MG tablet Commonly known as:  DELTASONE 4 tablets x 2 days, 3 tabs x 2 days, 2 tabs x 2 days, 1 tab x 2 days          Objective:   Physical  Exam BP 108/70 (BP Location: Left Arm, Patient Position: Sitting, Cuff Size: Normal)   Pulse (!) 57   Temp 97.5 F (36.4 C) (Oral)   Resp 12   Ht 5\' 9"  (1.753 m)   Wt 174 lb 2 oz (79 kg)   SpO2 98%   BMI 25.71 kg/m  General:   Well developed, well nourished . NAD.  HEENT:  Normocephalic . Face symmetric, atraumatic  Lower extremities: No edema, hip rotation normal Abdomen:  Not distended, soft, non-tender. No rebound or rigidity.  Skin: Not pale. Not jaundice Neurologic:  alert & oriented X3.  Speech normal, + antalgic posture when he lays down in the examining table, gait is ok. DTRs decreased symmetrically, straight leg test negative. Psych--  Cognition and judgment appear intact.  Cooperative with normal attention span and concentration.  Behavior appropriate. No anxious or depressed appearing.    Assessment & Plan:    Assessment Hyperlipidemia GERD Chronic hoarseness  Allergic rhinitis MSK:  Chronic Back pain, has seen neurosurgery, [facility]-Dr Nudelman Eczema - referred to derm 2016 History of hemorrhoids +PPD  2012, suspect from BCG  Plan: Chronic back pain with acute exacerbation:  neurological exam is nonfocal, request a referral to see his neurosurgeon. Plan: Referral, restart Relafen, prednisone, Tylenol. See instructions  Advice to come for a physical exam at his convenience

## 2015-12-08 NOTE — Progress Notes (Signed)
Pre visit review using our clinic review tool, if applicable. No additional management support is needed unless otherwise documented below in the visit note. 

## 2015-12-08 NOTE — Patient Instructions (Signed)
GO TO THE FRONT DESK Schedule your next appointment for a  complete physical exam at your convenience   Take prednisone as prescribed for few days  Take Relafen as prescribed as needed for pain.  Always take it with food because may cause gastritis and ulcers.  If you notice nausea, stomach pain, change in the color of stools --->  Stop the medicine and let us know  Also okay to use Tylenol and a heating pad

## 2015-12-10 DIAGNOSIS — Z09 Encounter for follow-up examination after completed treatment for conditions other than malignant neoplasm: Secondary | ICD-10-CM | POA: Insufficient documentation

## 2015-12-10 NOTE — Assessment & Plan Note (Signed)
Chronic back pain with acute exacerbation:  neurological exam is nonfocal, request a referral to see his neurosurgeon. Plan: Referral, restart Relafen, prednisone, Tylenol. See instructions  Advice to come for a physical exam at his convenience

## 2015-12-13 ENCOUNTER — Telehealth: Payer: Self-pay | Admitting: Internal Medicine

## 2015-12-13 MED ORDER — HYDROCODONE-ACETAMINOPHEN 5-325 MG PO TABS: 1.0000 | ORAL_TABLET | Freq: Three times a day (TID) | ORAL | 0 refills | Status: DC | PRN

## 2015-12-13 NOTE — Telephone Encounter (Signed)
Already referred to neurosurgery See prescription for hydrocodone, to take as needed, warn patient about excessive somnolence and avoid driving.

## 2015-12-13 NOTE — Telephone Encounter (Signed)
Hydrocodone Rx placed at front desk for pick up at Pt's convenience.

## 2015-12-13 NOTE — Telephone Encounter (Addendum)
Relation to WG:NFAOpt:self Call back number:[phone] Pharmacy: [id number] [place], [place] - [address] AT [facility] [address] & [address] [phone] (Phone) [phone])    Reason for call:  Patient states neurologist appointment is not until 12/29/15 and nabumetone (RELAFEN) 750 MG tablet and predisone is not working. Patient states he cant stand for 5 minutes due to back pain. Requesting a new Rx

## 2015-12-13 NOTE — Telephone Encounter (Signed)
Please advise 

## 2015-12-13 NOTE — Telephone Encounter (Signed)
LMOM informing Pt to return call.  

## 2016-03-20 ENCOUNTER — Other Ambulatory Visit: Payer: Self-pay | Admitting: Internal Medicine

## 2016-04-03 ENCOUNTER — Other Ambulatory Visit: Payer: Self-pay | Admitting: Internal Medicine

## 2016-04-08 ENCOUNTER — Telehealth: Payer: Self-pay | Admitting: Internal Medicine

## 2016-04-08 MED ORDER — PANTOPRAZOLE SODIUM 40 MG PO TBEC: 40.0000 mg | DELAYED_RELEASE_TABLET | Freq: Every day | ORAL | 1 refills | Status: DC

## 2016-04-08 NOTE — Telephone Encounter (Signed)
Is okay to call #30, one refill. He is due  for a CPX, please arrange

## 2016-04-08 NOTE — Telephone Encounter (Signed)
Caller name: Relationship to patient: Self Can be reached: [phone]  Pharmacy:  [id number] - [place], [place] - [address] AT [facility] [address] [phone]) [phone])     Reason for call: States he is out of his acid reflux medication but does not know the name of it and needs a refill

## 2016-04-08 NOTE — Telephone Encounter (Signed)
Rx sent. Pt has appt 04/11/2016.

## 2016-04-08 NOTE — Telephone Encounter (Signed)
Pt is requesting refill on Pantoprazole 40mg . No longer on med list. Please advise.

## 2016-04-11 ENCOUNTER — Ambulatory Visit: Payer: BLUE CROSS/BLUE SHIELD | Admitting: Internal Medicine

## 2016-04-12 ENCOUNTER — Ambulatory Visit: Payer: BLUE CROSS/BLUE SHIELD | Admitting: Internal Medicine

## 2016-04-12 DIAGNOSIS — Z0289 Encounter for other administrative examinations: Secondary | ICD-10-CM

## 2016-04-15 ENCOUNTER — Telehealth: Payer: Self-pay

## 2016-04-15 ENCOUNTER — Telehealth: Payer: Self-pay | Admitting: Internal Medicine

## 2016-04-15 ENCOUNTER — Encounter: Payer: Self-pay | Admitting: Internal Medicine

## 2016-04-15 ENCOUNTER — Ambulatory Visit: Payer: BLUE CROSS/BLUE SHIELD | Admitting: Internal Medicine

## 2016-04-15 DIAGNOSIS — Z0289 Encounter for other administrative examinations: Secondary | ICD-10-CM

## 2016-04-15 MED ORDER — PANTOPRAZOLE SODIUM 40 MG PO TBEC: 40.0000 mg | DELAYED_RELEASE_TABLET | Freq: Every day | ORAL | 1 refills | Status: DC

## 2016-04-15 NOTE — Telephone Encounter (Signed)
Okay to refill Protonix one month and one refill. 2 no-shows almost back-to-back!

## 2016-04-15 NOTE — Telephone Encounter (Signed)
Error. Duplicate telephone note.

## 2016-04-15 NOTE — Telephone Encounter (Signed)
Caller name: Joren Relationship to patient: self Can be reached: [phone] Pharmacy: [facility] [id number] - [place], [place] - [address] [facility] [address]  Reason for call: pt called in 9:41am to confirm appt for today at 11:00 or 11:30. I advised pt that appt was at 9:30am and he has missed it. Pt stated "I give up." Pt stated that he needed refill on acid reflux medication and he requested before but we would not refill and he requested appt. He stated he was 5 minutes late on 12/8 and we stated he needed to reschedule. Per notes pt was 20 min late. Pt requested msg to provider and call back from nurse/CMA.

## 2016-04-15 NOTE — Telephone Encounter (Signed)
Rx sent 

## 2016-04-15 NOTE — Telephone Encounter (Signed)
Note    pantoprazole (PROTONIX) 40 MG tablet 30 tablet 1 04/08/2016    Sig - Route: Take 1 tablet (40 mg total) by mouth daily before breakfast. - Oral   E-Prescribing Status: Receipt confirmed by pharmacy (04/08/2016 4:32 PM EST)    See above. Pantoprazole was refilled on 04/08/2016 #30 tabs and 1RF.   Pt was also NS on 04/11/2016 and today 04/15/2016.

## 2016-04-15 NOTE — Telephone Encounter (Signed)
pantoprazole (PROTONIX) 40 MG tablet 30 tablet 1 04/08/2016    Sig - Route: Take 1 tablet (40 mg total) by mouth daily before breakfast. - Oral   E-Prescribing Status: Receipt confirmed by pharmacy (04/08/2016 4:32 PM EST)    See above. Pantoprazole was refilled on 04/08/2016 #30 tabs and 1RF.   Pt was also NS on 04/11/2016 and today 04/15/2016.

## 2016-04-18 ENCOUNTER — Telehealth: Payer: Self-pay | Admitting: Internal Medicine

## 2016-04-18 NOTE — Telephone Encounter (Signed)
Patient is going to [place] next week. He is requesting a prescription for malaria. Please advise.   Patient phone: [phone]

## 2016-04-18 NOTE — Telephone Encounter (Signed)
Pt will need 30 minute travel advice appt w/ PCP or another provider.

## 2016-04-19 NOTE — Telephone Encounter (Signed)
Appointment scheduled for 04/23/16 with Efraim KaufmannMelissa

## 2016-04-23 ENCOUNTER — Ambulatory Visit (INDEPENDENT_AMBULATORY_CARE_PROVIDER_SITE_OTHER): Payer: BLUE CROSS/BLUE SHIELD | Admitting: Family

## 2016-04-23 ENCOUNTER — Encounter: Payer: Self-pay | Admitting: Family

## 2016-04-23 VITALS — BP 123/80 | HR 67 | Temp 97.8°F | Resp 16 | Ht 69.0 in | Wt 173.0 lb

## 2016-04-23 DIAGNOSIS — Z7189 Other specified counseling: Secondary | ICD-10-CM

## 2016-04-23 DIAGNOSIS — Z23 Encounter for immunization: Secondary | ICD-10-CM

## 2016-04-23 DIAGNOSIS — Z7185 Encounter for immunization safety counseling: Secondary | ICD-10-CM

## 2016-04-23 MED ORDER — ATOVAQUONE-PROGUANIL HCL 250-100 MG PO TABS: ORAL_TABLET | ORAL | 0 refills | Status: DC

## 2016-04-23 MED ORDER — CIPROFLOXACIN HCL 500 MG PO TABS: ORAL_TABLET | ORAL | 0 refills | Status: DC

## 2016-04-23 NOTE — Progress Notes (Signed)
Pre visit review using our clinic review tool, if applicable. No additional management support is needed unless otherwise documented below in the visit note. 

## 2016-04-23 NOTE — Progress Notes (Signed)
Subjective:    Patient ID: Cody Frank, male    DOB: 1970-09-15, 45 y.o.   MRN: [id number]  HPI   [name] is a 45 yr old male who presents today to discuss travel medication needs. He will be travelling to [place] for [place] weeks to visit his elderly parents.  He had typhoid and yellow fever vaccination last year.    He reports AM headaches about 2-3 days a week.  Notes some chronic sinus congestion.      Review of Systems Past Medical History:  Diagnosis Date  . Allergic rhinitis   . Eczema   . GERD (gastroesophageal reflux disease)   . Headache(784.0)   . History of chicken pox    Titered on 10/22/2010  . Hoarseness   . Hyperlipidemia   . Low back pain syndrome    Rx'ed MRI by Dr. Newell Frank     Social History   Social History  . Marital status: Married    Spouse name: N/A  . Number of children: 4  . Years of education: N/A   Occupational History  . active job    Social History Main Topics  . Smoking status: Never Smoker  . Smokeless tobacco: Never Used  . Alcohol use No  . Drug use: No  . Sexual activity: Not on file   Other Topics Concern  . Not on file   Social History Narrative   Married with 4 children    He is originally from [place]        Past Surgical History:  Procedure Laterality Date  . NO PAST SURGERIES      Family History  Problem Relation Age of Onset  . CAD Neg Hx   . Diabetes Neg Hx   . Colon cancer Neg Hx   . Prostate cancer Neg Hx     Allergies  Allergen Reactions  . Amoxicillin Nausea And Vomiting    Current Outpatient Prescriptions on File Prior to Visit  Medication Sig Dispense Refill  . nabumetone (RELAFEN) 750 MG tablet Take 1 tablet (750 mg total) by mouth daily as needed. 30 tablet 6  . pantoprazole (PROTONIX) 40 MG tablet Take 1 tablet (40 mg total) by mouth daily before breakfast. 30 tablet 1   No current facility-administered medications on file prior to visit.     BP 123/80 (BP Location: Right Arm,  Cuff Size: Normal)   Pulse 67   Temp 97.8 F (36.6 C) (Oral)   Resp 16   Ht 5\' 9"  (1.753 m)   Wt 173 lb (78.5 kg)   SpO2 100% Comment: room air  BMI 25.55 kg/m       Objective:   Physical Exam  Constitutional: He is oriented to person, place, and time. He appears well-developed and well-nourished. No distress.  HENT:  Head: Normocephalic and atraumatic.  Cardiovascular: Normal rate and regular rhythm.   No murmur heard. Pulmonary/Chest: Effort normal and breath sounds normal. No respiratory distress. He has no wheezes. He has no rales.  Musculoskeletal: He exhibits no edema.  Neurological: He is alert and oriented to person, place, and time.  Skin: Skin is warm and dry.  Psychiatric: He has a normal mood and affect. His behavior is normal. Thought content normal.          Assessment & Plan:  Immunization counseling- yellow fever and typhoid are up to date. Rx sent for malarone for malaria prophylaxis and cipro as needed for traveller's diarrhea. The patient is  also due for hep A #2 and Hep B #3.   Headaches- mild, sound likely related to sinus congestion. Advised pt to begin trial of zyrtec and to follow back up if this does not help his headaches.

## 2016-04-23 NOTE — Patient Instructions (Signed)
Begin Malorone 2 days before you leave and continue for 1 week after you return home. You may use cipro as needed for traveller's diarrhea. Have a safe trip.

## 2016-06-17 ENCOUNTER — Ambulatory Visit (INDEPENDENT_AMBULATORY_CARE_PROVIDER_SITE_OTHER): Payer: Self-pay | Admitting: Medical

## 2016-06-17 ENCOUNTER — Encounter: Payer: Self-pay | Admitting: Medical

## 2016-06-17 VITALS — BP 120/79 | HR 58 | Temp 97.9°F | Resp 16 | Ht 69.0 in | Wt 170.0 lb

## 2016-06-17 DIAGNOSIS — R0981 Nasal congestion: Secondary | ICD-10-CM

## 2016-06-17 DIAGNOSIS — J3489 Other specified disorders of nose and nasal sinuses: Secondary | ICD-10-CM

## 2016-06-17 DIAGNOSIS — J029 Acute pharyngitis, unspecified: Secondary | ICD-10-CM

## 2016-06-17 DIAGNOSIS — J0100 Acute maxillary sinusitis, unspecified: Secondary | ICD-10-CM

## 2016-06-17 LAB — POCT RAPID STREP A (OFFICE): RAPID STREP A SCREEN: NEGATIVE

## 2016-06-17 LAB — POC INFLUENZA A&B (BINAX/QUICKVUE)
INFLUENZA A, POC: NEGATIVE
Influenza B, POC: NEGATIVE

## 2016-06-17 MED ORDER — FLUTICASONE PROPIONATE 50 MCG/ACT NA SUSP: 2.0000 | Freq: Every day | NASAL | 1 refills | Status: DC

## 2016-06-17 MED ORDER — PANTOPRAZOLE SODIUM 40 MG PO TBEC: 40.0000 mg | DELAYED_RELEASE_TABLET | Freq: Every day | ORAL | 1 refills | Status: DC

## 2016-06-17 MED ORDER — AZITHROMYCIN 250 MG PO TABS: ORAL_TABLET | ORAL | 0 refills | Status: DC

## 2016-06-17 MED ORDER — METHYLPREDNISOLONE ACETATE 40 MG/ML IJ SUSP
40.0000 mg | Freq: Once | INTRAMUSCULAR | Status: AC
  Administered 2016-06-17: 40 mg via INTRAMUSCULAR

## 2016-06-17 MED ORDER — NABUMETONE 750 MG PO TABS: 750.0000 mg | ORAL_TABLET | Freq: Every day | ORAL | 6 refills | Status: DC | PRN

## 2016-06-17 NOTE — Patient Instructions (Signed)
For sinus pressure/possible infection and st will rx azithromycin antibiotic.  For congestion will rx flonase nasal spray and gave  depomedrol 40 mg im injection.  Rest and hydrate.   Follow up in 7 days or as needed

## 2016-06-17 NOTE — Progress Notes (Signed)
Subjective:    Patient ID: Cody Frank, male    DOB: 10-18-1970, 46 y.o.   MRN: [id number]  HPI  Pt in with some sinus pressure, st, ha, pnd  For one month.  Pt has history of occasional sinus infections.   Pt has been doing some chorasceptic throat spray.(but not helping) No sprays to his nose.  Pt has not taken any antibiotic in past month.   Pt has some pain swollowing.  Pt symptoms started after being in [place]. He states exposed to a lot of dust. He speculates reaction to dust.   Review of Systems  Constitutional: Negative for chills, fatigue and fever.  HENT: Positive for congestion, sinus pain, sinus pressure and sore throat.   Respiratory: Negative for cough, chest tightness, shortness of breath and wheezing.   Cardiovascular: Negative for chest pain and palpitations.  Gastrointestinal: Negative for abdominal pain, blood in stool, diarrhea and nausea.  Musculoskeletal: Negative for back pain, myalgias and neck stiffness.  Skin: Negative for rash.  Neurological: Positive for headaches. Negative for dizziness, syncope, speech difficulty, weakness, light-headedness and numbness.  Hematological: Negative for adenopathy. Does not bruise/bleed easily.  Psychiatric/Behavioral: Negative for behavioral problems, confusion, sleep disturbance and suicidal ideas. The patient is not nervous/anxious.        Describes more frontal sinus pressure.    Past Medical History:  Diagnosis Date  . Allergic rhinitis   . Eczema   . GERD (gastroesophageal reflux disease)   . Headache(784.0)   . History of chicken pox    Titered on 10/22/2010  . Hoarseness   . Hyperlipidemia   . Low back pain syndrome    Rx'ed MRI by Dr. Newell Coral     Social History   Social History  . Marital status: Married    Spouse name: N/A  . Number of children: 4  . Years of education: N/A   Occupational History  . active job    Social History Main Topics  . Smoking status: Never Smoker  . Smokeless  tobacco: Never Used  . Alcohol use No  . Drug use: No  . Sexual activity: Not on file   Other Topics Concern  . Not on file   Social History Narrative   Married with 4 children    He is originally from [place]        Past Surgical History:  Procedure Laterality Date  . NO PAST SURGERIES      Family History  Problem Relation Age of Onset  . CAD Neg Hx   . Diabetes Neg Hx   . Colon cancer Neg Hx   . Prostate cancer Neg Hx     Allergies  Allergen Reactions  . Amoxicillin Nausea And Vomiting    No current outpatient prescriptions on file prior to visit.   No current facility-administered medications on file prior to visit.     BP 120/79 (BP Location: Right Arm, Patient Position: Sitting, Cuff Size: Large)   Pulse (!) 58   Temp 97.9 F (36.6 C) (Oral)   Resp 16   Ht 5\' 9"  (1.753 m)   Wt 170 lb (77.1 kg)   SpO2 100%   BMI 25.10 kg/m      Objective:   Physical Exam   General  Mental Status - Alert. General Appearance - Well groomed. Not in acute distress.  Skin Rashes- No Rashes.  HEENT Head- Normal. Ear Auditory Canal - Left- Normal. Right - Normal.Tympanic Membrane- Left- Normal. Right- Normal. Eye Sclera/Conjunctiva-  Left- Normal. Right- Normal. Nose & Sinuses Nasal Mucosa- Left-  Boggy and Congested. Right-  Boggy and  Congested.Bilateral maxillary and frontal sinus pressure. Mouth & Throat Lips: Upper Lip- Normal: no dryness, cracking, pallor, cyanosis, or vesicular eruption. Lower Lip-Normal: no dryness, cracking, pallor, cyanosis or vesicular eruption. Buccal Mucosa- Bilateral- No Aphthous ulcers. Oropharynx- No Discharge or Erythema. Tonsils: Characteristics- Bilateral- No Erythema or Congestion. Size/Enlargement- Bilateral- No enlargement. Discharge- bilateral-None.  Neck Neck- Supple. No Masses.   Chest and Lung Exam Auscultation: Breath Sounds:-Clear even and unlabored.  Cardiovascular Auscultation:Rythm- Regular, rate and  rhythm. Murmurs & Other Heart Sounds:Ausculatation of the heart reveal- No Murmurs.  Lymphatic Head & Neck General Head & Neck Lymphatics: Bilateral: Description- No Localized lymphadenopathy.      Assessment & Plan:  For sinus pressure/possible infection and st will rx azithromycin antibiotic.  For congestion will rx flonase nasal spray and gave  depomedrol 40 mg im injection.  Rest and hydrate.   Follow up in 7 days or as needed  Rapid strep and flu negative.   Pt symptoms may be allergic from dust exposure while in [place] followed by sinus infection and st from [place].   Eliza Grissinger, Ramon DredgeEdward, PA-C

## 2016-06-17 NOTE — Progress Notes (Signed)
Pre visit review using our clinic review tool, if applicable. No additional management support is needed unless otherwise documented below in the visit note/SLS  

## 2016-07-03 ENCOUNTER — Encounter: Payer: Self-pay | Admitting: Internal Medicine

## 2016-07-03 ENCOUNTER — Ambulatory Visit (HOSPITAL_BASED_OUTPATIENT_CLINIC_OR_DEPARTMENT_OTHER)
Admission: RE | Admit: 2016-07-03 | Discharge: 2016-07-03 | Disposition: A | Discharge status: Home / Self Care | Payer: 59 | Source: Ambulatory Visit | Attending: Internal Medicine | Admitting: Internal Medicine

## 2016-07-03 ENCOUNTER — Ambulatory Visit (INDEPENDENT_AMBULATORY_CARE_PROVIDER_SITE_OTHER): Payer: 59 | Admitting: Internal Medicine

## 2016-07-03 VITALS — BP 118/68 | HR 77 | Temp 97.8°F | Resp 12 | Ht 69.0 in | Wt 169.0 lb

## 2016-07-03 DIAGNOSIS — R519 Headache, unspecified: Secondary | ICD-10-CM

## 2016-07-03 DIAGNOSIS — R51 Headache: Secondary | ICD-10-CM | POA: Insufficient documentation

## 2016-07-03 DIAGNOSIS — R292 Abnormal reflex: Secondary | ICD-10-CM

## 2016-07-03 LAB — COMPREHENSIVE METABOLIC PANEL
ALK PHOS: 50 U/L (ref 39–117)
ALT: 11 U/L (ref 0–53)
AST: 14 U/L (ref 0–37)
Albumin: 4.3 g/dL (ref 3.5–5.2)
BILIRUBIN TOTAL: 0.4 mg/dL (ref 0.2–1.2)
BUN: 13 mg/dL (ref 6–23)
CALCIUM: 9.3 mg/dL (ref 8.4–10.5)
CO2: 28 mEq/L (ref 19–32)
Chloride: 107 mEq/L (ref 96–112)
Creatinine, Ser: 1.06 mg/dL (ref 0.40–1.50)
GFR: 96.76 mL/min (ref >60.00)
Glucose, Bld: 107 mg/dL — ABNORMAL HIGH (ref 70–99)
POTASSIUM: 4.3 meq/L (ref 3.5–5.1)
Sodium: 139 mEq/L (ref 135–145)
TOTAL PROTEIN: 7.1 g/dL (ref 6.0–8.3)

## 2016-07-03 LAB — CBC WITH DIFFERENTIAL/PLATELET
BASOS ABS: 0.1 10*3/uL (ref 0.0–0.1)
Basophils Relative: 1 % (ref 0.0–3.0)
EOS PCT: 2.9 % (ref 0.0–5.0)
Eosinophils Absolute: 0.2 10*3/uL (ref 0.0–0.7)
HEMATOCRIT: 37.8 % — AB (ref 39.0–52.0)
HEMOGLOBIN: 11.7 g/dL — AB (ref 13.0–17.0)
LYMPHS PCT: 31.8 % (ref 12.0–46.0)
Lymphs Abs: 1.7 10*3/uL (ref 0.7–4.0)
MCHC: 30.9 g/dL (ref 30.0–36.0)
MCV: 74.5 fl — AB (ref 78.0–100.0)
MONOS PCT: 7.4 % (ref 3.0–12.0)
Monocytes Absolute: 0.4 10*3/uL (ref 0.1–1.0)
NEUTROS PCT: 56.9 % (ref 43.0–77.0)
Neutro Abs: 3.1 10*3/uL (ref 1.4–7.7)
Platelets: 251 10*3/uL (ref 150.0–400.0)
RBC: 5.08 Mil/uL (ref 4.22–5.81)
RDW: 13.8 % (ref 11.5–15.5)
WBC: 5.5 10*3/uL (ref 4.0–10.5)

## 2016-07-03 LAB — TSH: TSH: 1.18 u[IU]/mL (ref 0.35–4.50)

## 2016-07-03 LAB — SEDIMENTATION RATE: Sed Rate: 12 mm/hr (ref 0–15)

## 2016-07-03 MED ORDER — PREDNISONE 10 MG PO TABS: ORAL_TABLET | ORAL | 0 refills | Status: DC

## 2016-07-03 MED ORDER — CYCLOBENZAPRINE HCL 10 MG PO TABS: 10.0000 mg | ORAL_TABLET | Freq: Two times a day (BID) | ORAL | 0 refills | Status: DC | PRN

## 2016-07-03 MED ORDER — KETOROLAC TROMETHAMINE 10 MG PO TABS: 10.0000 mg | ORAL_TABLET | Freq: Four times a day (QID) | ORAL | 0 refills | Status: DC | PRN

## 2016-07-03 NOTE — Progress Notes (Signed)
Subjective:    Patient ID: Cody Frank, male    DOB: 09-23-1970, 46 y.o.   MRN: [id number]  DOS:  07/03/2016 Type of visit - description : Acute Interval history: Was seen 06-17-16 with one-month history of sore throat, headache, postnasal dripping. Strep test and flu tests were negative. Was diagnosed with sinusitis, prescribed Zithromax, got a steroids shot.  He is here today because he still has HAs and sore throat. He has a long history of headaches, they have been definitely more persistent and intense for the last month. Some of these HAs have been  "the worst of his life". Has episodes almost every day, the last few hours, not associated with nausea or visual disturbances. Partial response to Excedrin and Goody powders. Also the sore throat, daily and persistent. The patient went to [place] the visit, came back 05/15/2016. While he was there he did not get sick.  Review of Systems Currently with no fever chills  No sinus pressure No nasal discharge No cough. Denies any rash. Admits to occasional heartburn. No major problems with anxiety or depression. Not taking Relafen or Protonix regularly  Past Medical History:  Diagnosis Date  . Allergic rhinitis   . Eczema   . GERD (gastroesophageal reflux disease)   . Headache(784.0)   . History of chicken pox    Titered on 10/22/2010  . Hoarseness   . Hyperlipidemia   . Low back pain syndrome    Rx'ed MRI by Dr. Newell CoralNudelman    Past Surgical History:  Procedure Laterality Date  . NO PAST SURGERIES      Social History   Social History  . Marital status: Married    Spouse name: N/A  . Number of children: 4  . Years of education: N/A   Occupational History  . active job    Social History Main Topics  . Smoking status: Never Smoker  . Smokeless tobacco: Never Used  . Alcohol use No  . Drug use: No  . Sexual activity: Not on file   Other Topics Concern  . Not on file   Social History Narrative   Married with  4 children    He is originally from [place]          Allergies as of 07/03/2016      Reactions   Amoxicillin Nausea And Vomiting      Medication List       Accurate as of 07/03/16 11:59 PM. Always use your most recent med list.          cyclobenzaprine 10 MG tablet Commonly known as:  FLEXERIL Take 1 tablet (10 mg total) by mouth 2 (two) times daily as needed for muscle spasms.   fluticasone 50 MCG/ACT nasal spray Commonly known as:  FLONASE Place 2 sprays into both nostrils daily.   ketorolac 10 MG tablet Commonly known as:  TORADOL Take 1 tablet (10 mg total) by mouth every 6 (six) hours as needed.   pantoprazole 40 MG tablet Commonly known as:  PROTONIX Take 1 tablet (40 mg total) by mouth daily before breakfast.   predniSONE 10 MG tablet Commonly known as:  DELTASONE 4 tablets x 2 days, 3 tabs x 2 days, 2 tabs x 2 days, 1 tab x 2 days          Objective:   Physical Exam BP 118/68 (BP Location: Left Arm, Patient Position: Sitting, Cuff Size: Normal)   Pulse 77   Temp 97.8 F (36.6 C) (  Oral)   Resp 12   Ht 5\' 9"  (1.753 m)   Wt 169 lb (76.7 kg)   SpO2 98%   BMI 24.96 kg/m  General:   Well developed, well nourished . NAD.  HEENT:  Normocephalic . Face symmetric, atraumatic. Throat symmetric, no red no discharge Neck: No TTP at the cervical spine, range of motion is normal Lungs:  CTA B Normal respiratory effort, no intercostal retractions, no accessory muscle use. Heart: RRR,  no murmur.  No pretibial edema bilaterally  Skin: Not pale. Not jaundice Neurologic:  alert & oriented X3.  Speech normal, gait appropriate for age and unassisted EOMI, pupils equal and reactive, DTRs symmetrically decreased throughout. Psych--  Cognition and judgment appear intact.  Cooperative with normal attention span and concentration.  Behavior appropriate. No anxious or depressed appearing.      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 Tensional ? Migraines?. Plan: CT head today, CBC, BMP, sedimentation rate. Round of prednisone, Flexeril at night, Toradol as needed, refer to neuro, ER if symptoms severe. Decrease DTRs: Check TSH Sore throat: Unclear etiology, no infex on clinical grounds,  has occasional heartburn. Trial with Protonix but again etiology is not clear. RTC 2 weeks.

## 2016-07-03 NOTE — Patient Instructions (Addendum)
GO TO THE LAB : Get the blood work     GO TO THE FRONT DESK Schedule your next appointment for a  checkup in 2 or 3 weeks  Will do a CT of the head today  For headaches: Take prednisone as prescribed Flexeril, a muscle relaxant every night, you can take an additional one during the daytime if needed Stop OTCs, take Toradol as needed for pain  ER if: Severe headache, unable to get the headache under control, fever, chills, rash.  For  sore throat:  Protonix one before breakfast

## 2016-07-03 NOTE — Progress Notes (Signed)
Pre visit review using our clinic review tool, if applicable. No additional management support is needed unless otherwise documented below in the visit note. 

## 2016-07-04 NOTE — Assessment & Plan Note (Signed)
Headache:  Long h/o HAs, exacerbated for a month, some of them have been "the worst of his life". Tensional ? Migraines?. Plan: CT head today, CBC, BMP, sedimentation rate. Round of prednisone, Flexeril at night, Toradol as needed, refer to neuro, ER if symptoms severe. Decrease DTRs: Check TSH Sore throat: Unclear etiology, no infex on clinical grounds,  has occasional heartburn. Trial with Protonix but again etiology is not clear. RTC 2 weeks.

## 2016-07-18 NOTE — Progress Notes (Signed)
[facility]    Provider:  Dr Lucia GaskinsAhern Referring Provider: Wanda PlumpPaz, Jose E, MD Primary Care Physician:  Willow OraJose Paz, MD  CC:  Daily headaches  HPI:  Cody Frank is a 46 y.o. male here as a referral from Dr. Drue NovelPaz for daily headaches. Past medical history of eczema, headache, hyperlipidemia, low back pain. Headaches started in the last 10 years. Sleeping helps. He takes goody powder, excedrin and other OTC meds. Waking him from sleep. Headaches wake him up at night. They start in the back of the head and to the sides. It is pressure. Sometimes he can feel it behind his eyes pressure in the front. No light sensitivity, sound or smell sensitivity. Always wake him up during sleep after a few hours. He snores very loud. He is very tired during the day. He has a persistent dryness in his throat. Very tired during the day. He has to pull over the road sometimes because he is so tired. He dozes off even while driving. He works third shift. He almost falls asleep at the wheel. He goes to sleep at 930am and he wakes up at 220. His memory is poor. No other focal neurologic deficits, associated symptoms, inciting events or modifiable factors.  Reviewed notes, labs and imaging from outside physicians, which showed:   Reviewed primary care notes. He was seen 06/17/2016 with a one-month history of sore throat, headache, postnasal drip. Was diagnosed with sinusitis prescribed Zithromax steroid shot. Patient returned at the end of February still complaining of headaches and sore throat. He apparently has a long history of headaches but more persistent and intense for the last month. He describes some of the headaches is the worst of his life. Episodes are daily. They last several hours. Not associated with nausea or visual disturbance. He should has partial response to Excedrin and goody powders. Also sore throat daily and persistent. Patient went to [place] in January 2018. Labs were drawn CBC BMP  sedimentation rate. He was given prednisone, Flexeril and Toradol. CT of the head was ordered.  CT head 07/03/2016 showed No acute intracranial abnormalities including mass lesion or mass effect, hydrocephalus, extra-axial fluid collection, midline shift, hemorrhage, or acute infarction, large ischemic events (personally reviewed images)     Review of Systems: Patient complains of symptoms per HPI as well as the following symptoms: headache, cough, aching muscles, constipation, trouble swallowing, not enough sleep, change in appetite. Pertinent negatives per HPI. All others negative.   Social History   Social History  . Marital status: Married    Spouse name: N/A  . Number of children: 4  . Years of education: College   Occupational History  . [employer]    Social History Main Topics  . Smoking status: Never Smoker  . Smokeless tobacco: Never Used  . Alcohol use No  . Drug use: No  . Sexual activity: Not on file     Comment: Married   Other Topics Concern  . Not on file   Social History Narrative   Married with 4 children    He is originally from [place]   Right-handed   Caffeine: small coffee and soda occasionally    Family History  Problem Relation Age of Onset  . CAD Neg Hx   . Diabetes Neg Hx   . Colon cancer Neg Hx   . Prostate cancer Neg Hx     Past Medical History:  Diagnosis Date  . Allergic rhinitis   . Eczema   .  GERD (gastroesophageal reflux disease)   . Headache(784.0)   . History of chicken pox    Titered on 10/22/2010  . Hoarseness   . Hyperlipidemia   . Low back pain syndrome    Rx'ed MRI by Dr. Newell Coral    Past Surgical History:  Procedure Laterality Date  . NO PAST SURGERIES      Current Outpatient Prescriptions  Medication Sig Dispense Refill  . cyclobenzaprine (FLEXERIL) 10 MG tablet Take 1 tablet (10 mg total) by mouth 2 (two) times daily as needed for muscle spasms. 20 tablet 0  . fluticasone (FLONASE) 50 MCG/ACT nasal spray Place  2 sprays into both nostrils daily. 16 g 1  . ketorolac (TORADOL) 10 MG tablet Take 1 tablet (10 mg total) by mouth every 6 (six) hours as needed. 20 tablet 0  . pantoprazole (PROTONIX) 40 MG tablet Take 1 tablet (40 mg total) by mouth daily before breakfast. 30 tablet 1  . predniSONE (DELTASONE) 10 MG tablet 4 tablets x 2 days, 3 tabs x 2 days, 2 tabs x 2 days, 1 tab x 2 days 20 tablet 0   No current facility-administered medications for this visit.     Allergies as of 07/19/2016 - Review Complete 07/19/2016  Allergen Reaction Noted  . Amoxicillin Nausea And Vomiting 07/08/2012    Vitals: BP 121/73   Pulse 64   Ht 5\' 9"  (1.753 m)   Wt 169 lb 12.8 oz (77 kg)   BMI 25.08 kg/m  Last Weight:  Wt Readings from Last 1 Encounters:  07/19/16 169 lb 12.8 oz (77 kg)   Last Height:   Ht Readings from Last 1 Encounters:  07/19/16 5\' 9"  (1.753 m)    Physical exam: Exam: Gen: NAD, conversant, well nourised, well groomed                     CV: RRR, no MRG. No Carotid Bruits. No peripheral edema, warm, nontender Eyes: Conjunctivae clear without exudates or hemorrhage  Neuro: Detailed Neurologic Exam  Speech:    Speech is normal; fluent and spontaneous with normal comprehension.  Cognition:    The patient is oriented to person, place, and time;     recent and remote memory intact;     language fluent;     normal attention, concentration,     fund of knowledge Cranial Nerves:    The pupils are equal, round, and reactive to light. The fundi are normal and spontaneous venous pulsations are present. Visual fields are full to finger confrontation. Extraocular movements are intact. Trigeminal sensation is intact and the muscles of mastication are normal. The face is symmetric. The palate elevates in the midline. Hearing intact. Voice is normal. Shoulder shrug is normal. The tongue has normal motion without fasciculations.   Coordination:    Normal finger to nose and heel to shin. Normal  rapid alternating movements.   Gait:    Heel-toe and tandem gait are normal.   Motor Observation:    No asymmetry, no atrophy, and no involuntary movements noted. Tone:    Normal muscle tone.    Posture:    Posture is normal. normal erect    Strength:    Strength is V/V in the upper and lower limbs.      Sensation: intact to LT     Reflex Exam:  DTR's:    Deep tendon reflexes in the upper and lower extremities are normal bilaterally.   Toes:    The toes are  downgoing bilaterally.   Clonus:    Clonus is absent.      Assessment/Plan:  Patient with nocturnal and morning headaches, snoring, excessive fatigued (nods off while driving and needs to pull over).  Discussed sleep apnea vs sleep deprivation as he only sleeps from 930pm-220am. But given his snoring and excessive daytime fatigue (epworth sleepiness scale 20) need a sleep study. Also discussed medication overuse and rebound.  Sleep study: Snores, ESS 20, excessive daytime fatigue, headaches wake him from his sleep Discussed good sleep hygiene and needed 7-8 hours daily of sleep  Discussed: To prevent or relieve headaches, try the following: Cool Compress. Lie down and place a cool compress on your head.  Avoid headache triggers. If certain foods or odors seem to have triggered your migraines in the past, avoid them. A headache diary might help you identify triggers.  Include physical activity in your daily routine. Try a daily walk or other moderate aerobic exercise.  Manage stress. Find healthy ways to cope with the stressors, such as delegating tasks on your to-do list.  Practice relaxation techniques. Try deep breathing, yoga, massage and visualization.  Eat regularly. Eating regularly scheduled meals and maintaining a healthy diet might help prevent headaches. Also, drink plenty of fluids.  Follow a regular sleep schedule. Sleep deprivation might contribute to headaches Consider biofeedback. With this mind-body  technique, you learn to control certain bodily functions - such as muscle tension, heart rate and blood pressure - to prevent headaches or reduce headache pain.    Proceed to emergency room if you experience new or worsening symptoms or symptoms do not resolve, if you have new neurologic symptoms or if headache is severe, or for any concerning symptom.   Cc: Dr. Renaldo Fiddler, MD  [facility] [address] [place], [place] [address]  Phone [phone] Fax [phone]

## 2016-07-19 ENCOUNTER — Encounter: Payer: Self-pay | Admitting: Neurology

## 2016-07-19 ENCOUNTER — Ambulatory Visit (INDEPENDENT_AMBULATORY_CARE_PROVIDER_SITE_OTHER): Payer: 59 | Admitting: Neurology

## 2016-07-19 ENCOUNTER — Telehealth: Payer: Self-pay | Admitting: Neurology

## 2016-07-19 VITALS — BP 121/73 | HR 64 | Ht 69.0 in | Wt 169.8 lb

## 2016-07-19 DIAGNOSIS — R0683 Snoring: Secondary | ICD-10-CM | POA: Diagnosis not present

## 2016-07-19 DIAGNOSIS — R519 Headache, unspecified: Secondary | ICD-10-CM

## 2016-07-19 DIAGNOSIS — G4719 Other hypersomnia: Secondary | ICD-10-CM | POA: Diagnosis not present

## 2016-07-19 DIAGNOSIS — R51 Headache: Secondary | ICD-10-CM

## 2016-07-19 NOTE — Telephone Encounter (Signed)
I referred this patient to sleep studies can you make sure it on the queue? Thanks Zella Ballobin and [employer]

## 2016-07-19 NOTE — Patient Instructions (Signed)
Remember to drink plenty of fluid, eat healthy meals and do not skip any meals. Try to eat protein with a every meal and eat a healthy snack such as fruit or nuts in between meals. Try to keep a regular sleep-wake schedule and try to exercise daily, particularly in the form of walking, 20-30 minutes a day, if you can.   As far as diagnostic testing: Sleep eval  I would like to see you back for sleep, sooner if we need to. Please call us with any interim questions, concerns, problems, updates or refill requests.    Our phone number is [phone]. We also have an after hours call service for urgent matters and there is a physician on-call for urgent questions. For any emergencies you know to call 911 or go to the nearest emergency room   Sleep Apnea Sleep apnea is a condition in which breathing pauses or becomes shallow during sleep. Episodes of sleep apnea usually last 10 seconds or longer, and they may occur as many as 20 times an hour. Sleep apnea disrupts your sleep and keeps your body from getting the rest that it needs. This condition can increase your risk of certain health problems, including:  Heart attack.  Stroke.  Obesity.  Diabetes.  Heart failure.  Irregular heartbeat. There are three kinds of sleep apnea:  Obstructive sleep apnea. This kind is caused by a blocked or collapsed airway.  Central sleep apnea. This kind happens when the part of the brain that controls breathing does not send the correct signals to the muscles that control breathing.  Mixed sleep apnea. This is a combination of obstructive and central sleep apnea. What are the causes? The most common cause of this condition is a collapsed or blocked airway. An airway can collapse or become blocked if:  Your throat muscles are abnormally relaxed.  Your tongue and tonsils are larger than normal.  You are overweight.  Your airway is smaller than normal. What increases the risk? This condition is more  likely to develop in people who:  Are overweight.  Smoke.  Have a smaller than normal airway.  Are elderly.  Are male.  Drink alcohol.  Take sedatives or tranquilizers.  Have a family history of sleep apnea. What are the signs or symptoms? Symptoms of this condition include:  Trouble staying asleep.  Daytime sleepiness and tiredness.  Irritability.  Loud snoring.  Morning headaches.  Trouble concentrating.  Forgetfulness.  Decreased interest in sex.  Unexplained sleepiness.  Mood swings.  Personality changes.  Feelings of depression.  Waking up often during the night to urinate.  Dry mouth.  Sore throat. How is this diagnosed? This condition may be diagnosed with:  A medical history.  A physical exam.  A series of tests that are done while you are sleeping (sleep study). These tests are usually done in a sleep lab, but they may also be done at home. How is this treated? Treatment for this condition aims to restore normal breathing and to ease symptoms during sleep. It may involve managing health issues that can affect breathing, such as high blood pressure or obesity. Treatment may include:  Sleeping on your side.  Using a decongestant if you have nasal congestion.  Avoiding the use of depressants, including alcohol, sedatives, and narcotics.  Losing weight if you are overweight.  Making changes to your diet.  Quitting smoking.  Using a device to open your airway while you sleep, such as:  An oral appliance. This is  a custom-made mouthpiece that shifts your lower jaw forward.  A continuous positive airway pressure (CPAP) device. This device delivers oxygen to your airway through a mask.  A nasal expiratory positive airway pressure (EPAP) device. This device has valves that you put into each nostril.  A bi-level positive airway pressure (BPAP) device. This device delivers oxygen to your airway through a mask.  Surgery if other  treatments do not work. During surgery, excess tissue is removed to create a wider airway. It is important to get treatment for sleep apnea. Without treatment, this condition can lead to:  High blood pressure.  Coronary artery disease.  (Men) An inability to achieve or maintain an erection (impotence).  Reduced thinking abilities. Follow these instructions at home:  Make any lifestyle changes that your health care provider recommends.  Eat a healthy, well-balanced diet.  Take over-the-counter and prescription medicines only as told by your health care provider.  Avoid using depressants, including alcohol, sedatives, and narcotics.  Take steps to lose weight if you are overweight.  If you were given a device to open your airway while you sleep, use it only as told by your health care provider.  Do not use any tobacco products, such as cigarettes, chewing tobacco, and e-cigarettes. If you need help quitting, ask your health care provider.  Keep all follow-up visits as told by your health care provider. This is important. Contact a health care provider if:  The device that you received to open your airway during sleep is uncomfortable or does not seem to be working.  Your symptoms do not improve.  Your symptoms get worse. Get help right away if:  You develop chest pain.  You develop shortness of breath.  You develop discomfort in your back, arms, or stomach.  You have trouble speaking.  You have weakness on one side of your body.  You have drooping in your face. These symptoms may represent a serious problem that is an emergency. Do not wait to see if the symptoms will go away. Get medical help right away. Call your local emergency services (911 in the [place] Do not drive yourself to the hospital. This information is not intended to replace advice given to you by your health care provider. Make sure you discuss any questions you have with your health care  provider. Document Released: 04/12/2002 Document Revised: 12/17/2015 Document Reviewed: 01/30/2015 Elsevier Interactive Patient Education  2017 [employer]

## 2016-07-21 ENCOUNTER — Telehealth: Payer: Self-pay | Admitting: Neurology

## 2016-07-21 NOTE — Telephone Encounter (Signed)
Zella BallRobin, can you ensure this patient is in the sleep queue? Thanks!

## 2016-07-29 ENCOUNTER — Ambulatory Visit (INDEPENDENT_AMBULATORY_CARE_PROVIDER_SITE_OTHER): Payer: 59 | Admitting: Neurology

## 2016-07-29 ENCOUNTER — Encounter: Payer: Self-pay | Admitting: Neurology

## 2016-07-29 VITALS — BP 128/72 | HR 78 | Resp 16 | Ht 69.0 in | Wt 171.0 lb

## 2016-07-29 DIAGNOSIS — R51 Headache: Secondary | ICD-10-CM | POA: Diagnosis not present

## 2016-07-29 DIAGNOSIS — R0683 Snoring: Secondary | ICD-10-CM | POA: Diagnosis not present

## 2016-07-29 DIAGNOSIS — R400 Somnolence: Secondary | ICD-10-CM

## 2016-07-29 DIAGNOSIS — G4726 Circadian rhythm sleep disorder, shift work type: Secondary | ICD-10-CM

## 2016-07-29 DIAGNOSIS — R519 Headache, unspecified: Secondary | ICD-10-CM

## 2016-07-29 NOTE — Progress Notes (Signed)
Subjective:    Patient ID: Cody Frank is a 46 y.o. male.  HPI     Cody Foley, MD, PhD [facility] [address] [address] [place], [place] [address]  Dear [name],    I saw your patient, Cody Frank, upon your kind request in my clinic today for initial consultation of his sleep disorder, in particular, concern for underlying obstructive sleep apnea. The patient is accompanied by his youngest son today. As you know, Cody Frank is a 1 year old right-handed gentleman with an underlying medical history of allergic rhinitis, eczema, reflux disease, hyperlipidemia, low back pain,  recurrent headaches and overweight state, who reports snoring, excessive daytime somnolence, and morning headaches. I reviewed your office note from 07/19/2016. 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 His BT is around 10-11 AM and WT is 2:20 PM and he picks up his son. He naps sometimes, but not daily. His Epworth sleepiness score is 19 out of 24, fatigue score is 37 out of 63. He is a nonsmoker, does not drink alcohol and does not drink caffeine on a daily basis. He lives at home with his wife and children, youngest one in third grade, he has a 46 year old, a 46 year old and oldest one is in college. He works from 11 PM to 7:30 AM. He works for [employer].  He has been told that snoring is loud. He denies RLS symptoms. He has HAs upon awakening.   He is going to see ENT as I understand for recurrent sore throat.  His Past Medical History Is Significant For: Past Medical History:  Diagnosis Date  . Allergic rhinitis   . Eczema   . GERD (gastroesophageal reflux disease)   . Headache(784.0)   . History of chicken pox    Titered on 10/22/2010  . Hoarseness   . Hyperlipidemia   . Low back pain syndrome    Rx'ed MRI by Dr. Newell Coral    His Past Surgical History Is Significant For: Past Surgical History:  Procedure Laterality Date  . NO PAST SURGERIES       His Family History Is Significant For: Family History  Problem Relation Age of Onset  . CAD Neg Hx   . Diabetes Neg Hx   . Colon cancer Neg Hx   . Prostate cancer Neg Hx     His Social History Is Significant For: Social History   Social History  . Marital status: Married    Spouse name: N/A  . Number of children: 4  . Years of education: College   Occupational History  . [employer]    Social History Main Topics  . Smoking status: Never Smoker  . Smokeless tobacco: Never Used  . Alcohol use No  . Drug use: No  . Sexual activity: Not Asked     Comment: Married   Other Topics Concern  . None   Social History Narrative   Married with 4 children    He is originally from [place]   Right-handed   Caffeine: small coffee and soda occasionally    His Allergies Are:  Allergies  Allergen Reactions  . Amoxicillin Nausea And Vomiting  :   His Current Medications Are:  Outpatient Encounter Prescriptions as of 07/29/2016  Medication Sig  . cyclobenzaprine (FLEXERIL) 10 MG tablet Take 1 tablet (10 mg total) by mouth 2 (two) times daily as needed for muscle spasms.  . fluticasone (FLONASE) 50 MCG/ACT nasal spray Place 2 sprays into  both nostrils daily.  Marland Kitchen ketorolac (TORADOL) 10 MG tablet Take 1 tablet (10 mg total) by mouth every 6 (six) hours as needed.  . pantoprazole (PROTONIX) 40 MG tablet Take 1 tablet (40 mg total) by mouth daily before breakfast.  . predniSONE (DELTASONE) 10 MG tablet 4 tablets x 2 days, 3 tabs x 2 days, 2 tabs x 2 days, 1 tab x 2 days   No facility-administered encounter medications on file as of 07/29/2016.   :  Review of Systems:  Out of a complete 14 point review of systems, all are reviewed and negative with the exception of these symptoms as listed below:                                                 Review of Systems  Neurological:       Patient has trouble falling and staying asleep, snores, talks in his sleep, wakes up feeling tired,  works night shift, wakes up with a headache, daytime fatigue.   Epworth Sleepiness Scale 0= would never doze 1= slight chance of dozing 2= moderate chance of dozing 3= high chance of dozing  Sitting and reading:3 Watching TV:2 Sitting inactive in a public place (ex. Theater or meeting):3 As a passenger in a car for an hour without a break:3 Lying down to rest in the afternoon:2 Sitting and talking to someone:2 Sitting quietly after lunch (no alcohol):2 In a car, while stopped in traffic:2 Total:19   Objective:  Neurologic Exam  Physical Exam Physical Examination:   Vitals:   07/29/16 1602  BP: 128/72  Pulse: 78  Resp: 16    General Examination: The patient is a very pleasant 46 y.o. male in no acute distress. He appears well-developed and well-nourished and well groomed.   HEENT: Normocephalic, atraumatic, pupils are equal, round and reactive to light and accommodation. Funduscopic exam is normal with sharp disc margins noted. Extraocular tracking is good without limitation to gaze excursion or nystagmus noted. Normal smooth pursuit is noted. Hearing is grossly intact. Tympanic membranes are clear bilaterally. Face is symmetric with normal facial animation and normal facial sensation. Speech is clear with no dysarthria noted. There is no hypophonia. There is no lip, neck/head, jaw or voice tremor. Neck is supple with full range of passive and active motion. There are no carotid bruits on auscultation. Oropharynx exam reveals: mild mouth dryness, adequate dental hygiene and mild airway crowding, due to Larger appearing uvula, tonsils in place of about 1+ bilaterally, Mallampati is class II. Tongue protrudes centrally and palate elevates symmetrically. He has a mild overbite, nasal inspection revealed no significant nasal mucosal swelling or bogginess. He has mild pharyngeal erythema, very tiny septal deviation to the left, mild overbite. Neck circumference is 16-1/8 inches.  Chest:  Clear to auscultation without wheezing, rhonchi or crackles noted.  Heart: S1+S2+0, regular and normal without murmurs, rubs or gallops noted.   Abdomen: Soft, non-tender and non-distended with normal bowel sounds appreciated on auscultation.  Extremities: There is no pitting edema in the distal lower extremities bilaterally. Pedal pulses are intact.  Skin: Warm and dry without trophic changes noted.  Musculoskeletal: exam reveals no obvious joint deformities, tenderness or joint swelling or erythema.   Neurologically:  Mental status: The patient is awake, alert and oriented in all 4 spheres. His immediate and remote memory, attention, language skills  and fund of knowledge are appropriate. There is no evidence of aphasia, agnosia, apraxia or anomia. Speech is clear with normal prosody and enunciation. Thought process is linear. Mood is normal and affect is normal.  Cranial nerves II - XII are as described above under HEENT exam. In addition: shoulder shrug is normal with equal shoulder height noted. Motor exam: Normal bulk, strength and tone is noted. There is no drift, tremor or rebound. Romberg is negative. Reflexes are 1+ throughout. Fine motor skills and coordination: intact with normal finger taps, normal hand movements, normal rapid alternating patting, normal foot taps and normal foot agility.  Cerebellar testing: No dysmetria or intention tremor on finger to nose testing. Heel to shin is unremarkable bilaterally. There is no truncal or gait ataxia.  Sensory exam: intact to light touch in the upper and lower extremities.  Gait, station and balance: He stands easily. No veering to one side is noted. No leaning to one side is noted. Posture is age-appropriate and stance is narrow based. Gait shows normal stride length and normal pace. No problems turning are noted. Tandem walk is unremarkable.   Assessment and Plan:  In summary, Traveon Roarty is a very pleasant 46 y.o.-year old male with an  underlying medical history of allergic rhinitis, eczema, reflux disease, hyperlipidemia, low back pain,  recurrent headaches and overweight state, whose history and physical exam are concerning for obstructive sleep apnea (OSA). Contributing to his significant hypersomnolence are shift work and most likely chronic sleep deprivation. I had a long chat with the patient about my findings and the diagnosis of OSA, its prognosis and treatment options. We talked about medical treatments, surgical interventions and non-pharmacological approaches. I explained in particular the risks and ramifications of untreated moderate to severe OSA, especially with respect to developing cardiovascular disease down the Road, including congestive heart failure, difficult to treat hypertension, cardiac arrhythmias, or stroke. Even type 2 diabetes has, in part, been linked to untreated OSA. Symptoms of untreated OSA include daytime sleepiness, memory problems, mood irritability and mood disorder such as depression and anxiety, lack of energy, as well as recurrent headaches, especially morning headaches. We talked about trying to maintain a healthy lifestyle in general, as well as the importance of weight control. I encouraged the patient to eat healthy, exercise daily and keep well hydrated, to keep a scheduled bedtime and wake time routine, to not skip any meals and eat healthy snacks in between meals. I advised the patient not to drive when feeling sleepy.  I recommended the following at this time: sleep study with potential positive airway pressure titration. (We will score hypopneas at 4%).   I explained the sleep test procedure to the patient and also outlined possible surgical and non-surgical treatment options of OSA, including the use of a custom-made dental device (which would require a referral to a specialist dentist or oral surgeon), upper airway surgical options, such as pillar implants, radiofrequency surgery, tongue base  surgery, and UPPP (which would involve a referral to an ENT surgeon). Rarely, jaw surgery such as mandibular advancement may be considered.  I also explained the CPAP treatment option to the patient, who indicated that he would be willing to try CPAP if the need arises. I explained the importance of being compliant with PAP treatment, not only for insurance purposes but primarily to improve His symptoms, and for the patient's long term health benefit, including to reduce His cardiovascular risks. I answered all his questions today and the patient was in  agreement. I will see him back as necessary after his sleep study is completed and encouraged him to call with any interim questions, concerns, problems or updates.   Thank you very much for allowing me to participate in the care of this nice patient. If I can be of any further assistance to you please do not hesitate to talk to me.   Sincerely,   Cody FoleySaima Emanuele Mcwhirter, MD, PhD

## 2016-07-29 NOTE — Patient Instructions (Signed)

## 2016-08-05 ENCOUNTER — Telehealth: Payer: Self-pay | Admitting: Neurology

## 2016-08-05 DIAGNOSIS — G4726 Circadian rhythm sleep disorder, shift work type: Secondary | ICD-10-CM

## 2016-08-05 DIAGNOSIS — R519 Headache, unspecified: Secondary | ICD-10-CM

## 2016-08-05 DIAGNOSIS — R0683 Snoring: Secondary | ICD-10-CM

## 2016-08-05 DIAGNOSIS — R400 Somnolence: Secondary | ICD-10-CM

## 2016-08-05 DIAGNOSIS — R51 Headache: Secondary | ICD-10-CM

## 2016-08-05 NOTE — Telephone Encounter (Signed)
Order is in.

## 2016-08-05 NOTE — Telephone Encounter (Signed)
[employer] denied split sleep study.  Can I get an order for HST? °

## 2016-08-14 DIAGNOSIS — J383 Other diseases of vocal cords: Secondary | ICD-10-CM | POA: Diagnosis not present

## 2016-08-14 DIAGNOSIS — K219 Gastro-esophageal reflux disease without esophagitis: Secondary | ICD-10-CM | POA: Diagnosis not present

## 2016-09-23 ENCOUNTER — Encounter: Payer: Self-pay | Admitting: Neurology

## 2016-10-11 DIAGNOSIS — R0683 Snoring: Secondary | ICD-10-CM | POA: Diagnosis not present

## 2016-10-11 DIAGNOSIS — J383 Other diseases of vocal cords: Secondary | ICD-10-CM | POA: Diagnosis not present

## 2016-10-11 DIAGNOSIS — K219 Gastro-esophageal reflux disease without esophagitis: Secondary | ICD-10-CM | POA: Diagnosis not present

## 2016-10-16 ENCOUNTER — Encounter: Payer: 59 | Admitting: Internal Medicine

## 2016-10-16 DIAGNOSIS — Z0289 Encounter for other administrative examinations: Secondary | ICD-10-CM

## 2016-11-12 ENCOUNTER — Telehealth: Payer: Self-pay

## 2016-11-12 ENCOUNTER — Encounter: Payer: 59 | Admitting: Internal Medicine

## 2016-11-12 DIAGNOSIS — Z0289 Encounter for other administrative examinations: Secondary | ICD-10-CM

## 2016-11-12 NOTE — Telephone Encounter (Signed)
Would you like to begin dismissal process? Multiple no shows/cancellations:  11/12/2016-cpe-no show 10/16/2016-cpe-no show 04/15/2016-follow-up- no show 04/12/2016-acute visit- no show- 11 AM 04/12/2016-acute visit- cancel- 0845 AM 04/11/2016-acute visit- no show  Please advise.

## 2016-11-12 NOTE — Telephone Encounter (Signed)
Per our protocol, start dismissal

## 2016-11-12 NOTE — Telephone Encounter (Signed)
Letter printed, signed and forwarded to [place], Research officer, political partypractice administrator.

## 2016-11-21 ENCOUNTER — Telehealth: Payer: Self-pay | Admitting: Internal Medicine

## 2016-11-21 NOTE — Telephone Encounter (Signed)
Patient dismissed from [facility] by Dr. Willow OraJose Paz, effective 11/12/16. Dismissal letter sent out by certified / registered mail. fbg

## 2016-11-27 NOTE — Telephone Encounter (Signed)
Received signed domestic return receipt verifying delivery of certified letter on November 25, 2016. Article number [id number] daj

## 2017-02-26 ENCOUNTER — Ambulatory Visit (INDEPENDENT_AMBULATORY_CARE_PROVIDER_SITE_OTHER): Payer: 59 | Admitting: Physician Assistant

## 2017-02-26 ENCOUNTER — Encounter: Payer: Self-pay | Admitting: Physician Assistant

## 2017-02-26 VITALS — BP 116/76 | HR 74 | Temp 98.0°F | Resp 16 | Ht 68.0 in | Wt 173.2 lb

## 2017-02-26 DIAGNOSIS — G44209 Tension-type headache, unspecified, not intractable: Secondary | ICD-10-CM

## 2017-02-26 MED ORDER — RIZATRIPTAN BENZOATE 10 MG PO TABS: 10.0000 mg | ORAL_TABLET | ORAL | 1 refills | Status: DC | PRN

## 2017-02-26 MED ORDER — KETOROLAC TROMETHAMINE 60 MG/2ML IM SOLN
60.0000 mg | Freq: Once | INTRAMUSCULAR | Status: AC
  Administered 2017-02-26: 60 mg via INTRAMUSCULAR

## 2017-02-26 NOTE — Progress Notes (Signed)
Cody Frank  MRN: [id number] DOB: December 05, 1970  PCP: Patient, No Pcp Per  Subjective:  Pt is a 46 year old male who presents to clinic for headache. He is here today with his wife. Started last night - had to leave work due to pain. Front back of head to front on the left side. Eye is painful.  Pounding. Worse with sounds and light.  He took a tension headache Excedrin which brought pain down to 7/10 H/o headaches. recently, the headaches have been increasing in severity. Work attendance or other daily activities are affected by the headaches.  The "really bad ones" happen once or twice a month Less severe HA are perhaps more frequently - about once a week.  Sometimes wakes from sleep  The headaches are usually not preceded by an aura. Sometimes sees spots.  Sleep makes it better.  Flexeril did not help.  Denies nausea, vomiting.  The patient denies depression, dizziness, loss of balance, muscle weakness, numbness of extremities, speech difficulties and vision problems.  He has not been evaluated by headache clinic.   Pt was evaluated at [facility] for same c/c on 07/03/2016: "Long h/o HAs, exacerbated for a month, some of them have been "the worst of his life". Tensional ? Migraines?. Plan: CT head today, CBC, BMP, sedimentation rate. Round of prednisone, Flexeril at night, Toradol as needed, refer to neuro, ER if symptoms severe."  Review of Systems  Constitutional: Positive for activity change. Negative for chills, diaphoresis, fatigue and fever.  HENT: Negative for tinnitus.   Eyes: Positive for photophobia and visual disturbance. Negative for discharge.  Respiratory: Negative for cough.   Cardiovascular: Negative for chest pain and palpitations.  Gastrointestinal: Negative for nausea and vomiting.  Musculoskeletal: Negative for gait problem.  Neurological: Positive for headaches. Negative for dizziness, syncope, weakness, light-headedness and numbness.    Patient Active Problem  List   Diagnosis Date Noted  . PCP NOTES >>>>>>>>>>>>>>>>>>>>>>>>>>>> 12/10/2015  . Anemia 04/26/2014  . Chronic sore throat  10/06/2012  . Hemorrhoids 07/03/2012  . Chest pain, atypical 04/10/2011  . Annual physical exam 11/24/2010  . 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 School physical exam 10/24/2010  . GERD (gastroesophageal reflux disease)   . Hyperlipidemia 04/02/2010  . LOW BACK PAIN, CHRONIC 06/28/2008  . Hoarseness 03/10/2008  . Eczema 06/19/2007  . ALLERGIC RHINITIS 04/24/2007  . Headache 04/24/2007    Current Outpatient Prescriptions on File Prior to Visit  Medication Sig Dispense Refill  . pantoprazole (PROTONIX) 40 MG tablet Take 1 tablet (40 mg total) by mouth daily before breakfast. 30 tablet 1  . cyclobenzaprine (FLEXERIL) 10 MG tablet Take 1 tablet (10 mg total) by mouth 2 (two) times daily as needed for muscle spasms. (Patient not taking: Reported on 02/26/2017) 20 tablet 0  . fluticasone (FLONASE) 50 MCG/ACT nasal spray Place 2 sprays into both nostrils daily. (Patient not taking: Reported on 02/26/2017) 16 g 1   No current facility-administered medications on file prior to visit.     Allergies  Allergen Reactions  . Amoxicillin Nausea And Vomiting     Objective:  BP 116/76   Pulse 74   Temp 98 F (36.7 C) (Oral)   Resp 16   Ht 5\' 8"  (1.727 m)   Wt 173 lb 3.2 oz (78.6 kg)   SpO2 99%   BMI 26.33 kg/m   Physical Exam  Constitutional: He is oriented to person, place, and time and well-developed, well-nourished, and in no distress. No distress.  Eyes:  Pupils are equal, round, and reactive to light. Conjunctivae and EOM are normal.  Cardiovascular: Normal rate, regular rhythm and normal heart sounds.   Neurological: He is alert and oriented to person, place, and time. He has normal sensation and normal strength. He displays facial symmetry. Gait normal. GCS score is 15.  Skin: Skin is warm and dry.  Psychiatric: Mood, memory, affect and judgment normal.  Vitals  reviewed.   CT head 07/03/2016 IMPRESSION: Negative unenhanced CT of the brain. Assessment and Plan :  1. Tension-type headache, not intractable, unspecified chronicity pattern - ketorolac (TORADOL) injection 60 mg; Inject 2 mLs (60 mg total) into the muscle once. - rizatriptan (MAXALT) 10 MG tablet; Take 1 tablet (10 mg total) by mouth as needed for migraine. May repeat in 2 hours if needed  Dispense: 20 tablet; Refill: 1 - AMB referral to headache clinic - Pt has tried Flexeril for HA, no improvement. Neg head CT 8 months ago. Expressed relief after today's toradol injection. This is his first OV to PCP, he is unsure if he plans to have PCP as primary practice. Afraid may lose pt to f/u. Plan to refer to headache clinic for eval and treatment. He agrees.    Marco CollieWhitney Imberly Troxler, PA-C  [facility] 02/26/2017 12:10 PM

## 2017-02-26 NOTE — Patient Instructions (Addendum)
For your Migraine: Take Maxalt 10 mg once. May repeat after 2 hours if significant relief is not attained; maximum dose is 30 mg/24 hours  You will receive a phone call to schedule an appointment with the headache clinic.    Migraine Headache A migraine headache is an intense, throbbing pain on one side or both sides of the head. Migraines may also cause other symptoms, such as nausea, vomiting, and sensitivity to light and noise. What are the causes? Doing or taking certain things may also trigger migraines, such as:  Alcohol.  Smoking.  Medicines, such as: ? Medicine used to treat chest pain (nitroglycerine). ? Birth control pills. ? Estrogen pills. ? Certain blood pressure medicines.  Aged cheeses, chocolate, or caffeine.  Foods or drinks that contain nitrates, glutamate, aspartame, or tyramine.  Physical activity.  Other things that may trigger a migraine include:  Menstruation.  Pregnancy.  Hunger.  Stress, lack of sleep, too much sleep, or fatigue.  Weather changes.  What increases the risk? The following factors may make you more likely to experience migraine headaches:  Age. Risk increases with age.  Family history of migraine headaches.  Being Caucasian.  Depression and anxiety.  Obesity.  Being a woman.  Having a hole in the heart (patent foramen ovale) or other heart problems.  What are the signs or symptoms? The main symptom of this condition is pulsating or throbbing pain. Pain may:  Happen in any area of the head, such as on one side or both sides.  Interfere with daily activities.  Get worse with physical activity.  Get worse with exposure to bright lights or loud noises.  Other symptoms may include:  Nausea.  Vomiting.  Dizziness.  General sensitivity to bright lights, loud noises, or smells.  Before you get a migraine, you may get warning signs that a migraine is developing (aura). An aura may include:  Seeing flashing  lights or having blind spots.  Seeing bright spots, halos, or zigzag lines.  Having tunnel vision or blurred vision.  Having numbness or a tingling feeling.  Having trouble talking.  Having muscle weakness.  How is this diagnosed? A migraine headache can be diagnosed based on:  Your symptoms.  A physical exam.  Tests, such as CT scan or MRI of the head. These imaging tests can help rule out other causes of headaches.  Taking fluid from the spine (lumbar puncture) and analyzing it (cerebrospinal fluid analysis, or CSF analysis).  How is this treated? A migraine headache is usually treated with medicines that:  Relieve pain.  Relieve nausea.  Prevent migraines from coming back.  Treatment may also include:  Acupuncture.  Lifestyle changes like avoiding foods that trigger migraines.  Follow these instructions at home: Medicines  Take over-the-counter and prescription medicines only as told by your health care provider.  Do not drive or use heavy machinery while taking prescription pain medicine.  To prevent or treat constipation while you are taking prescription pain medicine, your health care provider may recommend that you: ? Drink enough fluid to keep your urine clear or pale yellow. ? Take over-the-counter or prescription medicines. ? Eat foods that are high in fiber, such as fresh fruits and vegetables, whole grains, and beans. ? Limit foods that are high in fat and processed sugars, such as fried and sweet foods. Lifestyle  Avoid alcohol use.  Do not use any products that contain nicotine or tobacco, such as cigarettes and e-cigarettes. If you need help quitting, ask  your health care provider.  Get at least 8 hours of sleep every night.  Limit your stress. General instructions   Keep a journal to find out what may trigger your migraine headaches. For example, write down: ? What you eat and drink. ? How much sleep you get. ? Any change to your diet  or medicines.  If you have a migraine: ? Avoid things that make your symptoms worse, such as bright lights. ? It may help to lie down in a dark, quiet room. ? Do not drive or use heavy machinery. ? Ask your health care provider what activities are safe for you while you are experiencing symptoms.  Keep all follow-up visits as told by your health care provider. This is important. Contact a health care provider if:  You develop symptoms that are different or more severe than your usual migraine symptoms. Get help right away if:  Your migraine becomes severe.  You have a fever.  You have a stiff neck.  You have vision loss.  Your muscles feel weak or like you cannot control them.  You start to lose your balance often.  You develop trouble walking.  You faint. This information is not intended to replace advice given to you by your health care provider. Make sure you discuss any questions you have with your health care provider. Document Released: 04/22/2005 Document Revised: 11/10/2015 Document Reviewed: 10/09/2015 Elsevier Interactive Patient Education  2017 [employer].   IF you received an x-ray today, you will receive an invoice from [facility]. Please contact [facility] at [phone] with questions or concerns regarding your invoice.   IF you received labwork today, you will receive an invoice from [facility]. Please contact [facility] at [phone] with questions or concerns regarding your invoice.   Our billing staff will not be able to assist you with questions regarding bills from these companies.  You will be contacted with the lab results as soon as they are available. The fastest way to get your results is to activate your My Chart account. Instructions are located on the last page of this paperwork. If you have not heard from [place] regarding the results in 2 weeks, please contact this office.

## 2017-04-22 DIAGNOSIS — G43719 Chronic migraine without aura, intractable, without status migrainosus: Secondary | ICD-10-CM | POA: Diagnosis not present

## 2017-04-22 DIAGNOSIS — Z79899 Other long term (current) drug therapy: Secondary | ICD-10-CM | POA: Diagnosis not present

## 2017-04-22 DIAGNOSIS — Z049 Encounter for examination and observation for unspecified reason: Secondary | ICD-10-CM | POA: Diagnosis not present

## 2017-08-26 ENCOUNTER — Ambulatory Visit (INDEPENDENT_AMBULATORY_CARE_PROVIDER_SITE_OTHER): Payer: 59 | Admitting: Emergency Medicine

## 2017-08-26 ENCOUNTER — Encounter: Payer: Self-pay | Admitting: Emergency Medicine

## 2017-08-26 ENCOUNTER — Other Ambulatory Visit: Payer: Self-pay

## 2017-08-26 VITALS — BP 108/66 | HR 58 | Temp 98.0°F | Resp 16 | Ht 68.5 in | Wt 175.6 lb

## 2017-08-26 DIAGNOSIS — R519 Headache, unspecified: Secondary | ICD-10-CM

## 2017-08-26 DIAGNOSIS — G4489 Other headache syndrome: Secondary | ICD-10-CM | POA: Diagnosis not present

## 2017-08-26 DIAGNOSIS — R51 Headache: Secondary | ICD-10-CM

## 2017-08-26 MED ORDER — BUTALBITAL-APAP-CAFFEINE 50-325-40 MG PO TABS: 1.0000 | ORAL_TABLET | Freq: Four times a day (QID) | ORAL | 0 refills | Status: DC | PRN

## 2017-08-26 MED ORDER — KETOROLAC TROMETHAMINE 60 MG/2ML IM SOLN
60.0000 mg | Freq: Once | INTRAMUSCULAR | Status: AC
  Administered 2017-08-26: 60 mg via INTRAMUSCULAR

## 2017-08-26 NOTE — Progress Notes (Signed)
Cody Frank 47 y.o.   Chief Complaint  Patient presents with  . Migraine    x 4 days    HISTORY OF PRESENT ILLNESS: This is a 47 y.o. male with a history of chronic migraine headaches.  Has had multiple brain CTs in the past, all normal.  Has been seen by neurologist and advised to undergo sleep studies but has not been able to yet.  Has been having atypical fluctuating bad migraine headache for the past week.  No atypical symptoms.  Denies head injury.  Has been taking Excedrin migraine with little relief.  Headache is throbbing, diffuse, associated with mild nausea.  Patient has erratic sleep pattern.  HPI   Prior to Admission medications   Medication Sig Start Date End Date Taking? Authorizing Provider  pantoprazole (PROTONIX) 40 MG tablet Take 1 tablet (40 mg total) by mouth daily before breakfast. Patient not taking: Reported on 08/26/2017 06/17/16   Saguier, Ramon Dredge, PA-C  rizatriptan (MAXALT) 10 MG tablet Take 1 tablet (10 mg total) by mouth as needed for migraine. May repeat in 2 hours if needed Patient not taking: Reported on 08/26/2017 02/26/17   McVey, Madelaine Bhat, PA-C    Allergies  Allergen Reactions  . Amoxicillin Nausea And Vomiting    Patient Active Problem List   Diagnosis Date Noted  . PCP NOTES >>>>>>>>>>>>>>>>>>>>>>>>>>>> 12/10/2015  . Anemia 04/26/2014  . Chronic sore throat  10/06/2012  . Hemorrhoids 07/03/2012  . Chest pain, atypical 04/10/2011  . Annual physical exam 11/24/2010  . 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 School physical exam 10/24/2010  . GERD (gastroesophageal reflux disease)   . Hyperlipidemia 04/02/2010  . LOW BACK PAIN, CHRONIC 06/28/2008  . Hoarseness 03/10/2008  . Eczema 06/19/2007  . ALLERGIC RHINITIS 04/24/2007  . Headache 04/24/2007    Past Medical History:  Diagnosis Date  . Allergic rhinitis   . Eczema   . GERD (gastroesophageal reflux disease)   . Headache(784.0)   . History of chicken pox    Titered on 10/22/2010  .  Hoarseness   . Hyperlipidemia   . Low back pain syndrome    Rx'ed MRI by Dr. Newell Coral    Past Surgical History:  Procedure Laterality Date  . NO PAST SURGERIES      Social History   Socioeconomic History  . Marital status: Married    Spouse name: Not on file  . Number of children: 4  . Years of education: College  . Highest education level: Not on file  Occupational History  . Occupation: [employer]  Social Needs  . Financial resource strain: Not on file  . Food insecurity:    Worry: Not on file    Inability: Not on file  . Transportation needs:    Medical: Not on file    Non-medical: Not on file  Tobacco Use  . Smoking status: Never Smoker  . Smokeless tobacco: Never Used  Substance and Sexual Activity  . Alcohol use: No  . Drug use: No  . Sexual activity: Not on file    Comment: Married  Lifestyle  . Physical activity:    Days per week: Not on file    Minutes per session: Not on file  . Stress: Not on file  Relationships  . Social connections:    Talks on phone: Not on file    Gets together: Not on file    Attends religious service: Not on file    Active member of club or organization: Not on file  Attends meetings of clubs or organizations: Not on file    Relationship status: Not on file  . Intimate partner violence:    Fear of current or ex partner: Not on file    Emotionally abused: Not on file    Physically abused: Not on file    Forced sexual activity: Not on file  Other Topics Concern  . Not on file  Social History Narrative   Married with 4 children    He is originally from [place]   Right-handed   Caffeine: small coffee and soda occasionally    Family History  Problem Relation Age of Onset  . CAD Neg Hx   . Diabetes Neg Hx   . Colon cancer Neg Hx   . Prostate cancer Neg Hx      Review of Systems  Constitutional: Negative.  Negative for chills and fever.  HENT: Negative.  Negative for congestion, hearing loss, nosebleeds and sinus  pain.   Eyes: Positive for photophobia. Negative for blurred vision and double vision.  Respiratory: Negative.  Negative for cough and shortness of breath.   Cardiovascular: Negative.  Negative for chest pain and palpitations.  Gastrointestinal: Positive for nausea. Negative for abdominal pain, blood in stool, diarrhea and vomiting.  Genitourinary: Negative.  Negative for dysuria and hematuria.  Musculoskeletal: Negative.  Negative for myalgias and neck pain.  Skin: Negative.  Negative for rash.  Neurological: Negative.  Negative for dizziness and headaches.  Endo/Heme/Allergies: Negative.   All other systems reviewed and are negative.  Vitals:   08/26/17 1047  BP: 108/66  Pulse: (!) 58  Resp: 16  Temp: 98 F (36.7 C)  SpO2: 98%     Physical Exam  Constitutional: He is oriented to person, place, and time. He appears well-developed and well-nourished.  HENT:  Head: Normocephalic and atraumatic.  Right Ear: External ear normal.  Left Ear: External ear normal.  Nose: Nose normal.  Mouth/Throat: Oropharynx is clear and moist.  Eyes: Pupils are equal, round, and reactive to light. Conjunctivae and EOM are normal.  Neck: Normal range of motion. Neck supple. No thyromegaly present.  Cardiovascular: Normal rate, regular rhythm and normal heart sounds.  Pulmonary/Chest: Effort normal and breath sounds normal.  Abdominal: Soft. Bowel sounds are normal. He exhibits no distension. There is no tenderness.  Musculoskeletal: Normal range of motion.  Lymphadenopathy:    He has no cervical adenopathy.  Neurological: He is alert and oriented to person, place, and time. He displays normal reflexes. No cranial nerve deficit or sensory deficit. He exhibits normal muscle tone. Coordination normal.  Skin: Skin is warm and dry. Capillary refill takes less than 2 seconds. No rash noted.  Psychiatric: He has a normal mood and affect. His behavior is normal.  Vitals reviewed.    ASSESSMENT &  PLAN: Delando was seen today for migraine.  Diagnoses and all orders for this visit:  Acute intractable headache, unspecified headache type -     butalbital-acetaminophen-caffeine (FIORICET, ESGIC) 50-325-40 MG tablet; Take 1-2 tablets by mouth every 6 (six) hours as needed for headache.  Other headache syndrome -     ketorolac (TORADOL) injection 60 mg    Patient Instructions       IF you received an x-ray today, you will receive an invoice from [facility]. Please contact [facility] at [phone] with questions or concerns regarding your invoice.   IF you received labwork today, you will receive an invoice from [facility]. Please contact [facility] at [phone] with  questions or concerns regarding your invoice.   Our billing staff will not be able to assist you with questions regarding bills from these companies.  You will be contacted with the lab results as soon as they are available. The fastest way to get your results is to activate your My Chart account. Instructions are located on the last page of this paperwork. If you have not heard from us regarding the results in 2 weeks, please contact this office.     General Headache Without Cause A headache is pain or discomfort felt around the head or neck area. The specific cause of a headache may not be found. There are many causes and types of headaches. A few common ones are:  Tension headaches.  Migraine headaches.  Cluster headaches.  Chronic daily headaches.  Follow these instructions at home: Watch your condition for any changes. Take these steps to help with your condition: Managing pain  Take over-the-counter and prescription medicines only as told by your health care provider.  Lie down in a dark, quiet room when you have a headache.  If directed, apply ice to the head and neck area: ? Put ice in a plastic bag. ? Place a towel between your skin and the bag. ? Leave the ice on for 20  minutes, 2-3 times per day.  Use a heating pad or hot shower to apply heat to the head and neck area as told by your health care provider.  Keep lights dim if bright lights bother you or make your headaches worse. Eating and drinking  Eat meals on a regular schedule.  Limit alcohol use.  Decrease the amount of caffeine you drink, or stop drinking caffeine. General instructions  Keep all follow-up visits as told by your health care provider. This is important.  Keep a headache journal to help find out what may trigger your headaches. For example, write down: ? What you eat and drink. ? How much sleep you get. ? Any change to your diet or medicines.  Try massage or other relaxation techniques.  Limit stress.  Sit up straight, and do not tense your muscles.  Do not use tobacco products, including cigarettes, chewing tobacco, or e-cigarettes. If you need help quitting, ask your health care provider.  Exercise regularly as told by your health care provider.  Sleep on a regular schedule. Get 7-9 hours of sleep, or the amount recommended by your health care provider. Contact a health care provider if:  Your symptoms are not helped by medicine.  You have a headache that is different from the usual headache.  You have nausea or you vomit.  You have a fever. Get help right away if:  Your headache becomes severe.  You have repeated vomiting.  You have a stiff neck.  You have a loss of vision.  You have problems with speech.  You have pain in the eye or ear.  You have muscular weakness or loss of muscle control.  You lose your balance or have trouble walking.  You feel faint or pass out.  You have confusion. This information is not intended to replace advice given to you by your health care provider. Make sure you discuss any questions you have with your health care provider. Document Released: 04/22/2005 Document Revised: 09/28/2015 Document Reviewed:  08/15/2014 Elsevier Interactive Patient Education  2018 [employer]      Edwina BarthMiguel Caitlinn Klinker, MD [facility]

## 2017-08-26 NOTE — Patient Instructions (Addendum)
IF you received an x-ray today, you will receive an invoice from [facility]. Please contact [facility] at [phone] with questions or concerns regarding your invoice.   IF you received labwork today, you will receive an invoice from [facility]. Please contact [facility] at [phone] with questions or concerns regarding your invoice.   Our billing staff will not be able to assist you with questions regarding bills from these companies.  You will be contacted with the lab results as soon as they are available. The fastest way to get your results is to activate your My Chart account. Instructions are located on the last page of this paperwork. If you have not heard from [place] regarding the results in 2 weeks, please contact this office.     General Headache Without Cause A headache is pain or discomfort felt around the head or neck area. The specific cause of a headache may not be found. There are many causes and types of headaches. A few common ones are:  Tension headaches.  Migraine headaches.  Cluster headaches.  Chronic daily headaches.  Follow these instructions at home: Watch your condition for any changes. Take these steps to help with your condition: Managing pain  Take over-the-counter and prescription medicines only as told by your health care provider.  Lie down in a dark, quiet room when you have a headache.  If directed, apply ice to the head and neck area: ? Put ice in a plastic bag. ? Place a towel between your skin and the bag. ? Leave the ice on for 20 minutes, 2-3 times per day.  Use a heating pad or hot shower to apply heat to the head and neck area as told by your health care provider.  Keep lights dim if bright lights bother you or make your headaches worse. Eating and drinking  Eat meals on a regular schedule.  Limit alcohol use.  Decrease the amount of caffeine you drink, or stop drinking caffeine. General instructions  Keep  all follow-up visits as told by your health care provider. This is important.  Keep a headache journal to help find out what may trigger your headaches. For example, write down: ? What you eat and drink. ? How much sleep you get. ? Any change to your diet or medicines.  Try massage or other relaxation techniques.  Limit stress.  Sit up straight, and do not tense your muscles.  Do not use tobacco products, including cigarettes, chewing tobacco, or e-cigarettes. If you need help quitting, ask your health care provider.  Exercise regularly as told by your health care provider.  Sleep on a regular schedule. Get 7-9 hours of sleep, or the amount recommended by your health care provider. Contact a health care provider if:  Your symptoms are not helped by medicine.  You have a headache that is different from the usual headache.  You have nausea or you vomit.  You have a fever. Get help right away if:  Your headache becomes severe.  You have repeated vomiting.  You have a stiff neck.  You have a loss of vision.  You have problems with speech.  You have pain in the eye or ear.  You have muscular weakness or loss of muscle control.  You lose your balance or have trouble walking.  You feel faint or pass out.  You have confusion. This information is not intended to replace advice given to you by your health care provider. Make sure you discuss  any questions you have with your health care provider. Document Released: 04/22/2005 Document Revised: 09/28/2015 Document Reviewed: 08/15/2014 Elsevier Interactive Patient Education  [employer]2018 [employer]

## 2017-09-30 ENCOUNTER — Ambulatory Visit (INDEPENDENT_AMBULATORY_CARE_PROVIDER_SITE_OTHER): Payer: 59 | Admitting: Emergency Medicine

## 2017-09-30 ENCOUNTER — Encounter: Payer: Self-pay | Admitting: Emergency Medicine

## 2017-09-30 ENCOUNTER — Other Ambulatory Visit: Payer: Self-pay

## 2017-09-30 VITALS — BP 110/68 | HR 57 | Temp 97.6°F | Resp 16 | Ht 68.25 in | Wt 169.0 lb

## 2017-09-30 DIAGNOSIS — Z0000 Encounter for general adult medical examination without abnormal findings: Secondary | ICD-10-CM

## 2017-09-30 DIAGNOSIS — Z8719 Personal history of other diseases of the digestive system: Secondary | ICD-10-CM | POA: Diagnosis not present

## 2017-09-30 DIAGNOSIS — R7611 Nonspecific reaction to tuberculin skin test without active tuberculosis: Secondary | ICD-10-CM | POA: Diagnosis not present

## 2017-09-30 DIAGNOSIS — Z9289 Personal history of other medical treatment: Secondary | ICD-10-CM

## 2017-09-30 MED ORDER — PANTOPRAZOLE SODIUM 40 MG PO TBEC: 40.0000 mg | DELAYED_RELEASE_TABLET | Freq: Every day | ORAL | 1 refills | Status: AC

## 2017-09-30 NOTE — Progress Notes (Signed)
Cody Frank 47 y.o.   Chief Complaint  Patient presents with  . Immunizations    per appointment for school program, per patient he is here for CPE  . Medication Refill    PROTONIX    HISTORY OF PRESENT ILLNESS: This is a 47 y.o. male Here for annual exam; no complaints and no medical concerns.   HPI   Prior to Admission medications   Medication Sig Start Date End Date Taking? Authorizing Provider  pantoprazole (PROTONIX) 40 MG tablet Take 1 tablet (40 mg total) by mouth daily before breakfast. 06/17/16  Yes Saguier, Ramon Dredge, PA-C  butalbital-acetaminophen-caffeine (FIORICET, ESGIC) 50-325-40 MG tablet Take 1-2 tablets by mouth every 6 (six) hours as needed for headache. Patient not taking: Reported on 09/30/2017 08/26/17 08/26/18  Georgina Quint, MD  rizatriptan (MAXALT) 10 MG tablet Take 1 tablet (10 mg total) by mouth as needed for migraine. May repeat in 2 hours if needed Patient not taking: Reported on 08/26/2017 02/26/17   McVey, Madelaine Bhat, PA-C    Allergies  Allergen Reactions  . Amoxicillin Nausea And Vomiting    Patient Active Problem List   Diagnosis Date Noted  . PCP NOTES >>>>>>>>>>>>>>>>>>>>>>>>>>>> 12/10/2015  . Anemia 04/26/2014  . Chronic sore throat  10/06/2012  . 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 GERD (gastroesophageal reflux disease)   . Hyperlipidemia 04/02/2010  . LOW BACK PAIN, CHRONIC 06/28/2008    Past Medical History:  Diagnosis Date  . Allergic rhinitis   . Eczema   . GERD (gastroesophageal reflux disease)   . Headache(784.0)   . History of chicken pox    Titered on 10/22/2010  . Hoarseness   . Hyperlipidemia   . Low back pain syndrome    Rx'ed MRI by Dr. Newell Coral    Past Surgical History:  Procedure Laterality Date  . NO PAST SURGERIES      Social History   Socioeconomic History  . Marital status: Married    Spouse name: Not on file  . Number of children: 4  . Years of education: College  . Highest education level:  Not on file  Occupational History  . Occupation: [employer]  Social Needs  . Financial resource strain: Not on file  . Food insecurity:    Worry: Not on file    Inability: Not on file  . Transportation needs:    Medical: Not on file    Non-medical: Not on file  Tobacco Use  . Smoking status: Never Smoker  . Smokeless tobacco: Never Used  Substance and Sexual Activity  . Alcohol use: No  . Drug use: No  . Sexual activity: Not on file    Comment: Married  Lifestyle  . Physical activity:    Days per week: Not on file    Minutes per session: Not on file  . Stress: Not on file  Relationships  . Social connections:    Talks on phone: Not on file    Gets together: Not on file    Attends religious service: Not on file    Active member of club or organization: Not on file    Attends meetings of clubs or organizations: Not on file    Relationship status: Not on file  . Intimate partner violence:    Fear of current or ex partner: Not on file    Emotionally abused: Not on file    Physically abused: Not on file    Forced sexual activity: Not on file  Other Topics Concern  .  Not on file  Social History Narrative   Married with 4 children    He is originally from [place]   Right-handed   Caffeine: small coffee and soda occasionally    Family History  Problem Relation Age of Onset  . CAD Neg Hx   . Diabetes Neg Hx   . Colon cancer Neg Hx   . Prostate cancer Neg Hx      Review of Systems  Constitutional: Negative.  Negative for chills, fever, malaise/fatigue and weight loss.  HENT: Negative.  Negative for congestion, hearing loss, nosebleeds and sore throat.   Eyes: Negative.  Negative for blurred vision and double vision.  Respiratory: Negative.  Negative for cough and shortness of breath.   Cardiovascular: Negative.  Negative for chest pain, palpitations, claudication and leg swelling.  Gastrointestinal: Negative.  Negative for abdominal pain, diarrhea, heartburn, nausea  and vomiting.  Genitourinary: Negative.  Negative for dysuria and hematuria.  Musculoskeletal: Negative.  Negative for back pain, joint pain, myalgias and neck pain.  Skin: Negative.  Negative for rash.  Neurological: Positive for headaches. Negative for dizziness, sensory change and focal weakness.  Endo/Heme/Allergies: Negative.     Vitals:   09/30/17 1200  BP: 110/68  Pulse: (!) 57  Resp: 16  Temp: 97.6 F (36.4 C)  SpO2: 98%    Physical Exam  Constitutional: He is oriented to person, place, and time. He appears well-developed and well-nourished.  HENT:  Head: Normocephalic and atraumatic.  Right Ear: External ear normal.  Left Ear: External ear normal.  Nose: Nose normal.  Mouth/Throat: Oropharynx is clear and moist.  Eyes: Pupils are equal, round, and reactive to light. Conjunctivae are normal.  Neck: Normal range of motion. Neck supple. No JVD present. No thyromegaly present.  Cardiovascular: Normal rate, regular rhythm, normal heart sounds and intact distal pulses.  Pulmonary/Chest: Effort normal and breath sounds normal.  Abdominal: Soft. Bowel sounds are normal. He exhibits no distension. There is no tenderness.  Musculoskeletal: Normal range of motion.  Lymphadenopathy:    He has no cervical adenopathy.  Neurological: He is alert and oriented to person, place, and time. No sensory deficit. He exhibits normal muscle tone. Coordination normal.  Skin: Skin is warm and dry. Capillary refill takes less than 2 seconds. No rash noted.  Psychiatric: He has a normal mood and affect. His behavior is normal.  Vitals reviewed.    ASSESSMENT & PLAN: Keygan was seen today for immunizations and medication refill.  Diagnoses and all orders for this visit:  Routine general medical examination at a health care facility -     CBC with Differential -     Comprehensive metabolic panel -     Hemoglobin A1c -     Lipid panel  History of gastroesophageal reflux (GERD) -      pantoprazole (PROTONIX) 40 MG tablet; Take 1 tablet (40 mg total) by mouth daily before breakfast.  History of positive PPD -     QuantiFERON-TB Gold Plus    Patient Instructions       IF you received an x-ray today, you will receive an invoice from [facility]. Please contact [facility] at [phone] with questions or concerns regarding your invoice.   IF you received labwork today, you will receive an invoice from [facility]. Please contact [facility] at [phone] with questions or concerns regarding your invoice.   Our billing staff will not be able to assist you with questions regarding bills from these companies.  You  will be contacted with the lab results as soon as they are available. The fastest way to get your results is to activate your My Chart account. Instructions are located on the last page of this paperwork. If you have not heard from [place] regarding the results in 2 weeks, please contact this office.      Health Maintenance, Male A healthy lifestyle and preventive care is important for your health and wellness. Ask your health care provider about what schedule of regular examinations is right for you. What should I know about weight and diet? Eat a Healthy Diet  Eat plenty of vegetables, fruits, whole grains, low-fat dairy products, and lean protein.  Do not eat a lot of foods high in solid fats, added sugars, or salt.  Maintain a Healthy Weight Regular exercise can help you achieve or maintain a healthy weight. You should:  Do at least 150 minutes of exercise each week. The exercise should increase your heart rate and make you sweat (moderate-intensity exercise).  Do strength-training exercises at least twice a week.  Watch Your Levels of Cholesterol and Blood Lipids  Have your blood tested for lipids and cholesterol every 5 years starting at 47 years of age. If you are at high risk for heart disease, you should start having your blood  tested when you are 47 years old. You may need to have your cholesterol levels checked more often if: ? Your lipid or cholesterol levels are high. ? You are older than 47 years of age. ? You are at high risk for heart disease.  What should I know about cancer screening? Many types of cancers can be detected early and may often be prevented. Lung Cancer  You should be screened every year for lung cancer if: ? You are a current smoker who has smoked for at least 30 years. ? You are a former smoker who has quit within the past 15 years.  Talk to your health care provider about your screening options, when you should start screening, and how often you should be screened.  Colorectal Cancer  Routine colorectal cancer screening usually begins at 47 years of age and should be repeated every 5-10 years until you are 47 years old. You may need to be screened more often if early forms of precancerous polyps or small growths are found. Your health care provider may recommend screening at an earlier age if you have risk factors for colon cancer.  Your health care provider may recommend using home test kits to check for hidden blood in the stool.  A small camera at the end of a tube can be used to examine your colon (sigmoidoscopy or colonoscopy). This checks for the earliest forms of colorectal cancer.  Prostate and Testicular Cancer  Depending on your age and overall health, your health care provider may do certain tests to screen for prostate and testicular cancer.  Talk to your health care provider about any symptoms or concerns you have about testicular or prostate cancer.  Skin Cancer  Check your skin from head to toe regularly.  Tell your health care provider about any new moles or changes in moles, especially if: ? There is a change in a mole's size, shape, or color. ? You have a mole that is larger than a pencil eraser.  Always use sunscreen. Apply sunscreen liberally and repeat  throughout the day.  Protect yourself by wearing long sleeves, pants, a wide-brimmed hat, and sunglasses when outside.  What should I  know about heart disease, diabetes, and high blood pressure?  If you are 49-32 years of age, have your blood pressure checked every 3-5 years. If you are 46 years of age or older, have your blood pressure checked every year. You should have your blood pressure measured twice-once when you are at a hospital or clinic, and once when you are not at a hospital or clinic. Record the average of the two measurements. To check your blood pressure when you are not at a hospital or clinic, you can use: ? An automated blood pressure machine at a pharmacy. ? A home blood pressure monitor.  Talk to your health care provider about your target blood pressure.  If you are between 42-72 years old, ask your health care provider if you should take aspirin to prevent heart disease.  Have regular diabetes screenings by checking your fasting blood sugar level. ? If you are at a normal weight and have a low risk for diabetes, have this test once every three years after the age of 33. ? If you are overweight and have a high risk for diabetes, consider being tested at a younger age or more often.  A one-time screening for abdominal aortic aneurysm (AAA) by ultrasound is recommended for men aged 65-75 years who are current or former smokers. What should I know about preventing infection? Hepatitis B If you have a higher risk for hepatitis B, you should be screened for this virus. Talk with your health care provider to find out if you are at risk for hepatitis B infection. Hepatitis C Blood testing is recommended for:  Everyone born from [birth year] through 1965.  Anyone with known risk factors for hepatitis C.  Sexually Transmitted Diseases (STDs)  You should be screened each year for STDs including gonorrhea and chlamydia if: ? You are sexually active and are younger than 47 years of  age. ? You are older than 47 years of age and your health care provider tells you that you are at risk for this type of infection. ? Your sexual activity has changed since you were last screened and you are at an increased risk for chlamydia or gonorrhea. Ask your health care provider if you are at risk.  Talk with your health care provider about whether you are at high risk of being infected with HIV. Your health care provider may recommend a prescription medicine to help prevent HIV infection.  What else can I do?  Schedule regular health, dental, and eye exams.  Stay current with your vaccines (immunizations).  Do not use any tobacco products, such as cigarettes, chewing tobacco, and e-cigarettes. If you need help quitting, ask your health care provider.  Limit alcohol intake to no more than 2 drinks per day. One drink equals 12 ounces of beer, 5 ounces of wine, or 1 ounces of hard liquor.  Do not use street drugs.  Do not share needles.  Ask your health care provider for help if you need support or information about quitting drugs.  Tell your health care provider if you often feel depressed.  Tell your health care provider if you have ever been abused or do not feel safe at home. This information is not intended to replace advice given to you by your health care provider. Make sure you discuss any questions you have with your health care provider. Document Released: 10/19/2007 Document Revised: 12/20/2015 Document Reviewed: 01/24/2015 Elsevier Interactive Patient Education  2018 [employer].      [place]  Mitchel Honour, MD [facility]

## 2017-09-30 NOTE — Patient Instructions (Addendum)
   IF you received an x-ray today, you will receive an invoice from [facility]. Please contact [facility] at [phone] with questions or concerns regarding your invoice.   IF you received labwork today, you will receive an invoice from [facility]. Please contact [facility] at [phone] with questions or concerns regarding your invoice.   Our billing staff will not be able to assist you with questions regarding bills from these companies.  You will be contacted with the lab results as soon as they are available. The fastest way to get your results is to activate your My Chart account. Instructions are located on the last page of this paperwork. If you have not heard from us regarding the results in 2 weeks, please contact this office.      Health Maintenance, Male A healthy lifestyle and preventive care is important for your health and wellness. Ask your health care provider about what schedule of regular examinations is right for you. What should I know about weight and diet? Eat a Healthy Diet  Eat plenty of vegetables, fruits, whole grains, low-fat dairy products, and lean protein.  Do not eat a lot of foods high in solid fats, added sugars, or salt.  Maintain a Healthy Weight Regular exercise can help you achieve or maintain a healthy weight. You should:  Do at least 150 minutes of exercise each week. The exercise should increase your heart rate and make you sweat (moderate-intensity exercise).  Do strength-training exercises at least twice a week.  Watch Your Levels of Cholesterol and Blood Lipids  Have your blood tested for lipids and cholesterol every 5 years starting at 47 years of age. If you are at high risk for heart disease, you should start having your blood tested when you are 47 years old. You may need to have your cholesterol levels checked more often if: ? Your lipid or cholesterol levels are high. ? You are older than 47 years of age. ? You  are at high risk for heart disease.  What should I know about cancer screening? Many types of cancers can be detected early and may often be prevented. Lung Cancer  You should be screened every year for lung cancer if: ? You are a current smoker who has smoked for at least 30 years. ? You are a former smoker who has quit within the past 15 years.  Talk to your health care provider about your screening options, when you should start screening, and how often you should be screened.  Colorectal Cancer  Routine colorectal cancer screening usually begins at 47 years of age and should be repeated every 5-10 years until you are 47 years old. You may need to be screened more often if early forms of precancerous polyps or small growths are found. Your health care provider may recommend screening at an earlier age if you have risk factors for colon cancer.  Your health care provider may recommend using home test kits to check for hidden blood in the stool.  A small camera at the end of a tube can be used to examine your colon (sigmoidoscopy or colonoscopy). This checks for the earliest forms of colorectal cancer.  Prostate and Testicular Cancer  Depending on your age and overall health, your health care provider may do certain tests to screen for prostate and testicular cancer.  Talk to your health care provider about any symptoms or concerns you have about testicular or prostate cancer.  Skin Cancer  Check your skin   from head to toe regularly.  Tell your health care provider about any new moles or changes in moles, especially if: ? There is a change in a mole's size, shape, or color. ? You have a mole that is larger than a pencil eraser.  Always use sunscreen. Apply sunscreen liberally and repeat throughout the day.  Protect yourself by wearing long sleeves, pants, a wide-brimmed hat, and sunglasses when outside.  What should I know about heart disease, diabetes, and high blood  pressure?  If you are 18-39 years of age, have your blood pressure checked every 3-5 years. If you are 40 years of age or older, have your blood pressure checked every year. You should have your blood pressure measured twice-once when you are at a hospital or clinic, and once when you are not at a hospital or clinic. Record the average of the two measurements. To check your blood pressure when you are not at a hospital or clinic, you can use: ? An automated blood pressure machine at a pharmacy. ? A home blood pressure monitor.  Talk to your health care provider about your target blood pressure.  If you are between 45-79 years old, ask your health care provider if you should take aspirin to prevent heart disease.  Have regular diabetes screenings by checking your fasting blood sugar level. ? If you are at a normal weight and have a low risk for diabetes, have this test once every three years after the age of 45. ? If you are overweight and have a high risk for diabetes, consider being tested at a younger age or more often.  A one-time screening for abdominal aortic aneurysm (AAA) by ultrasound is recommended for men aged 65-75 years who are current or former smokers. What should I know about preventing infection? Hepatitis B If you have a higher risk for hepatitis B, you should be screened for this virus. Talk with your health care provider to find out if you are at risk for hepatitis B infection. Hepatitis C Blood testing is recommended for:  Everyone born from 1945 through 1965.  Anyone with known risk factors for hepatitis C.  Sexually Transmitted Diseases (STDs)  You should be screened each year for STDs including gonorrhea and chlamydia if: ? You are sexually active and are younger than 47 years of age. ? You are older than 47 years of age and your health care provider tells you that you are at risk for this type of infection. ? Your sexual activity has changed since you were last  screened and you are at an increased risk for chlamydia or gonorrhea. Ask your health care provider if you are at risk.  Talk with your health care provider about whether you are at high risk of being infected with HIV. Your health care provider may recommend a prescription medicine to help prevent HIV infection.  What else can I do?  Schedule regular health, dental, and eye exams.  Stay current with your vaccines (immunizations).  Do not use any tobacco products, such as cigarettes, chewing tobacco, and e-cigarettes. If you need help quitting, ask your health care provider.  Limit alcohol intake to no more than 2 drinks per day. One drink equals 12 ounces of beer, 5 ounces of wine, or 1 ounces of hard liquor.  Do not use street drugs.  Do not share needles.  Ask your health care provider for help if you need support or information about quitting drugs.  Tell your health care   provider if you often feel depressed.  Tell your health care provider if you have ever been abused or do not feel safe at home. This information is not intended to replace advice given to you by your health care provider. Make sure you discuss any questions you have with your health care provider. Document Released: 10/19/2007 Document Revised: 12/20/2015 Document Reviewed: 01/24/2015 Elsevier Interactive Patient Education  2018 [employer]  

## 2017-10-01 ENCOUNTER — Encounter: Payer: Self-pay | Admitting: *Deleted

## 2017-10-03 LAB — LIPID PANEL
CHOL/HDL RATIO: 4 ratio (ref 0.0–5.0)
Cholesterol, Total: 225 mg/dL — ABNORMAL HIGH (ref 100–199)
HDL: 56 mg/dL (ref >39)
LDL CALC: 133 mg/dL — AB (ref 0–99)
TRIGLYCERIDES: 178 mg/dL — AB (ref 0–149)
VLDL CHOLESTEROL CAL: 36 mg/dL (ref 5–40)

## 2017-10-03 LAB — COMPREHENSIVE METABOLIC PANEL
ALBUMIN: 4.5 g/dL (ref 3.5–5.5)
ALT: 15 IU/L (ref 0–44)
AST: 20 IU/L (ref 0–40)
Albumin/Globulin Ratio: 1.7 (ref 1.2–2.2)
Alkaline Phosphatase: 60 IU/L (ref 39–117)
BUN / CREAT RATIO: 17 (ref 9–20)
BUN: 18 mg/dL (ref 6–24)
Bilirubin Total: 0.4 mg/dL (ref 0.0–1.2)
CHLORIDE: 105 mmol/L (ref 96–106)
CO2: 22 mmol/L (ref 20–29)
Calcium: 9.5 mg/dL (ref 8.7–10.2)
Creatinine, Ser: 1.08 mg/dL (ref 0.76–1.27)
GFR calc non Af Amer: 81 mL/min/{1.73_m2} (ref >59)
GFR, EST AFRICAN AMERICAN: 94 mL/min/{1.73_m2} (ref >59)
GLUCOSE: 109 mg/dL — AB (ref 65–99)
Globulin, Total: 2.7 g/dL (ref 1.5–4.5)
Potassium: 4.2 mmol/L (ref 3.5–5.2)
Sodium: 140 mmol/L (ref 134–144)
TOTAL PROTEIN: 7.2 g/dL (ref 6.0–8.5)

## 2017-10-03 LAB — CBC WITH DIFFERENTIAL/PLATELET
BASOS ABS: 0 10*3/uL (ref 0.0–0.2)
BASOS: 1 %
EOS (ABSOLUTE): 0.4 10*3/uL (ref 0.0–0.4)
Eos: 8 %
HEMOGLOBIN: 12.4 g/dL — AB (ref 13.0–17.7)
Hematocrit: 40.1 % (ref 37.5–51.0)
IMMATURE GRANS (ABS): 0 10*3/uL (ref 0.0–0.1)
Immature Granulocytes: 0 %
Lymphocytes Absolute: 2.1 10*3/uL (ref 0.7–3.1)
Lymphs: 44 %
MCH: 22.7 pg — AB (ref 26.6–33.0)
MCHC: 30.9 g/dL — ABNORMAL LOW (ref 31.5–35.7)
MCV: 73 fL — AB (ref 79–97)
Monocytes Absolute: 0.5 10*3/uL (ref 0.1–0.9)
Monocytes: 10 %
NEUTROS PCT: 37 %
Neutrophils Absolute: 1.8 10*3/uL (ref 1.4–7.0)
Platelets: 257 10*3/uL (ref 150–450)
RBC: 5.47 x10E6/uL (ref 4.14–5.80)
RDW: 14.3 % (ref 12.3–15.4)
WBC: 4.8 10*3/uL (ref 3.4–10.8)

## 2017-10-03 LAB — QUANTIFERON-TB GOLD PLUS
QUANTIFERON NIL VALUE: 0.01 [IU]/mL
QUANTIFERON TB1 AG VALUE: 0.06 [IU]/mL
QUANTIFERON TB2 AG VALUE: 0.06 [IU]/mL
QUANTIFERON-TB GOLD PLUS: NEGATIVE
QuantiFERON Mitogen Value: >10 IU/mL

## 2017-10-03 LAB — HEMOGLOBIN A1C
Est. average glucose Bld gHb Est-mCnc: 114 mg/dL
Hgb A1c MFr Bld: 5.6 % (ref 4.8–5.6)

## 2017-10-06 ENCOUNTER — Encounter: Payer: Self-pay | Admitting: *Deleted

## 2017-11-24 ENCOUNTER — Emergency Department (HOSPITAL_BASED_OUTPATIENT_CLINIC_OR_DEPARTMENT_OTHER)
Admission: EM | Admit: 2017-11-24 | Discharge: 2017-11-24 | Disposition: A | Discharge status: Home / Self Care | Payer: 59 | Attending: Emergency Medicine | Admitting: Emergency Medicine

## 2017-11-24 ENCOUNTER — Encounter (HOSPITAL_BASED_OUTPATIENT_CLINIC_OR_DEPARTMENT_OTHER): Payer: Self-pay | Admitting: Emergency Medicine

## 2017-11-24 ENCOUNTER — Emergency Department (HOSPITAL_BASED_OUTPATIENT_CLINIC_OR_DEPARTMENT_OTHER): Payer: 59

## 2017-11-24 ENCOUNTER — Other Ambulatory Visit: Payer: Self-pay

## 2017-11-24 DIAGNOSIS — R079 Chest pain, unspecified: Secondary | ICD-10-CM | POA: Diagnosis not present

## 2017-11-24 DIAGNOSIS — Y999 Unspecified external cause status: Secondary | ICD-10-CM | POA: Diagnosis not present

## 2017-11-24 DIAGNOSIS — Y939 Activity, unspecified: Secondary | ICD-10-CM | POA: Insufficient documentation

## 2017-11-24 DIAGNOSIS — S299XXA Unspecified injury of thorax, initial encounter: Secondary | ICD-10-CM | POA: Diagnosis not present

## 2017-11-24 DIAGNOSIS — R0789 Other chest pain: Secondary | ICD-10-CM | POA: Insufficient documentation

## 2017-11-24 DIAGNOSIS — Y92481 Parking lot as the place of occurrence of the external cause: Secondary | ICD-10-CM | POA: Insufficient documentation

## 2017-11-24 DIAGNOSIS — W19XXXA Unspecified fall, initial encounter: Secondary | ICD-10-CM | POA: Diagnosis not present

## 2017-11-24 MED ORDER — NAPROXEN 375 MG PO TABS: 375.0000 mg | ORAL_TABLET | Freq: Two times a day (BID) | ORAL | 0 refills | Status: DC

## 2017-11-24 MED ORDER — NAPROXEN 250 MG PO TABS
375.0000 mg | ORAL_TABLET | Freq: Once | ORAL | Status: AC
  Administered 2017-11-24: 375 mg via ORAL
  Filled 2017-11-24: qty 2
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
## 2017-12-17 ENCOUNTER — Other Ambulatory Visit: Payer: Self-pay

## 2017-12-17 ENCOUNTER — Ambulatory Visit (INDEPENDENT_AMBULATORY_CARE_PROVIDER_SITE_OTHER): Payer: 59 | Admitting: Emergency Medicine

## 2017-12-17 ENCOUNTER — Encounter: Payer: Self-pay | Admitting: Emergency Medicine

## 2017-12-17 VITALS — BP 126/74 | HR 59 | Temp 98.4°F | Resp 16 | Ht 68.0 in | Wt 169.0 lb

## 2017-12-17 DIAGNOSIS — S299XXA Unspecified injury of thorax, initial encounter: Secondary | ICD-10-CM | POA: Insufficient documentation

## 2017-12-17 DIAGNOSIS — S299XXD Unspecified injury of thorax, subsequent encounter: Secondary | ICD-10-CM

## 2017-12-17 NOTE — Patient Instructions (Addendum)
   I will contact you with your lab results within the next 2 weeks.  If you have not heard from us then please contact us. The fastest way to get your results is to register for My Chart.   IF you received an x-ray today, you will receive an invoice from [facility]. Please contact [facility] at [phone] with questions or concerns regarding your invoice.   IF you received labwork today, you will receive an invoice from [facility]. Please contact [facility] at [phone] with questions or concerns regarding your invoice.   Our billing staff will not be able to assist you with questions regarding bills from these companies.  You will be contacted with the lab results as soon as they are available. The fastest way to get your results is to activate your My Chart account. Instructions are located on the last page of this paperwork. If you have not heard from us regarding the results in 2 weeks, please contact this office.       Health Maintenance, Male A healthy lifestyle and preventive care is important for your health and wellness. Ask your health care provider about what schedule of regular examinations is right for you. What should I know about weight and diet? Eat a Healthy Diet  Eat plenty of vegetables, fruits, whole grains, low-fat dairy products, and lean protein.  Do not eat a lot of foods high in solid fats, added sugars, or salt.  Maintain a Healthy Weight Regular exercise can help you achieve or maintain a healthy weight. You should:  Do at least 150 minutes of exercise each week. The exercise should increase your heart rate and make you sweat (moderate-intensity exercise).  Do strength-training exercises at least twice a week.  Watch Your Levels of Cholesterol and Blood Lipids  Have your blood tested for lipids and cholesterol every 5 years starting at 47 years of age. If you are at high risk for heart disease, you should start having your blood  tested when you are 47 years old. You may need to have your cholesterol levels checked more often if: ? Your lipid or cholesterol levels are high. ? You are older than 47 years of age. ? You are at high risk for heart disease.  What should I know about cancer screening? Many types of cancers can be detected early and may often be prevented. Lung Cancer  You should be screened every year for lung cancer if: ? You are a current smoker who has smoked for at least 30 years. ? You are a former smoker who has quit within the past 15 years.  Talk to your health care provider about your screening options, when you should start screening, and how often you should be screened.  Colorectal Cancer  Routine colorectal cancer screening usually begins at 47 years of age and should be repeated every 5-10 years until you are 47 years old. You may need to be screened more often if early forms of precancerous polyps or small growths are found. Your health care provider may recommend screening at an earlier age if you have risk factors for colon cancer.  Your health care provider may recommend using home test kits to check for hidden blood in the stool.  A small camera at the end of a tube can be used to examine your colon (sigmoidoscopy or colonoscopy). This checks for the earliest forms of colorectal cancer.  Prostate and Testicular Cancer  Depending on your age and overall health,   your health care provider may do certain tests to screen for prostate and testicular cancer.  Talk to your health care provider about any symptoms or concerns you have about testicular or prostate cancer.  Skin Cancer  Check your skin from head to toe regularly.  Tell your health care provider about any new moles or changes in moles, especially if: ? There is a change in a mole's size, shape, or color. ? You have a mole that is larger than a pencil eraser.  Always use sunscreen. Apply sunscreen liberally and repeat  throughout the day.  Protect yourself by wearing long sleeves, pants, a wide-brimmed hat, and sunglasses when outside.  What should I know about heart disease, diabetes, and high blood pressure?  If you are 18-39 years of age, have your blood pressure checked every 3-5 years. If you are 40 years of age or older, have your blood pressure checked every year. You should have your blood pressure measured twice-once when you are at a hospital or clinic, and once when you are not at a hospital or clinic. Record the average of the two measurements. To check your blood pressure when you are not at a hospital or clinic, you can use: ? An automated blood pressure machine at a pharmacy. ? A home blood pressure monitor.  Talk to your health care provider about your target blood pressure.  If you are between 45-79 years old, ask your health care provider if you should take aspirin to prevent heart disease.  Have regular diabetes screenings by checking your fasting blood sugar level. ? If you are at a normal weight and have a low risk for diabetes, have this test once every three years after the age of 45. ? If you are overweight and have a high risk for diabetes, consider being tested at a younger age or more often.  A one-time screening for abdominal aortic aneurysm (AAA) by ultrasound is recommended for men aged 65-75 years who are current or former smokers. What should I know about preventing infection? Hepatitis B If you have a higher risk for hepatitis B, you should be screened for this virus. Talk with your health care provider to find out if you are at risk for hepatitis B infection. Hepatitis C Blood testing is recommended for:  Everyone born from 1945 through 1965.  Anyone with known risk factors for hepatitis C.  Sexually Transmitted Diseases (STDs)  You should be screened each year for STDs including gonorrhea and chlamydia if: ? You are sexually active and are younger than 47 years of  age. ? You are older than 47 years of age and your health care provider tells you that you are at risk for this type of infection. ? Your sexual activity has changed since you were last screened and you are at an increased risk for chlamydia or gonorrhea. Ask your health care provider if you are at risk.  Talk with your health care provider about whether you are at high risk of being infected with HIV. Your health care provider may recommend a prescription medicine to help prevent HIV infection.  What else can I do?  Schedule regular health, dental, and eye exams.  Stay current with your vaccines (immunizations).  Do not use any tobacco products, such as cigarettes, chewing tobacco, and e-cigarettes. If you need help quitting, ask your health care provider.  Limit alcohol intake to no more than 2 drinks per day. One drink equals 12 ounces of beer, 5 ounces   of wine, or 1 ounces of hard liquor.  Do not use street drugs.  Do not share needles.  Ask your health care provider for help if you need support or information about quitting drugs.  Tell your health care provider if you often feel depressed.  Tell your health care provider if you have ever been abused or do not feel safe at home. This information is not intended to replace advice given to you by your health care provider. Make sure you discuss any questions you have with your health care provider. Document Released: 10/19/2007 Document Revised: 12/20/2015 Document Reviewed: 01/24/2015 [employer] Interactive Patient Education  2018 [employer]  
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
## 2018-08-12 ENCOUNTER — Ambulatory Visit: Payer: Self-pay | Admitting: *Deleted

## 2018-08-12 NOTE — Telephone Encounter (Signed)
Pt reports non-productive cough "Mild," sneezing, teary eyes, onset Sunday, states was outside a lot over weekend.  Reports "Pollen allergies."  Denies fever, SOB, no CP, chest tightness. Denies any sinus tenderness. States body aches "But probably from my back pain." HAs been taking Zyrtec daily, minimal relief. Pt requesting decongestant be called in to pharmacy. Also requests refill of nabumetone, historical provider; pt dismissed from prior practice. Pt initially declined virtual visit, then agreed if meds cannot be called in for him. CAre advise given. Instructed to CB if symptoms worsen, SOB occurs. Please advise: Ph# and Email verified.  CB [phone]   Reason for Disposition . COVID-19, questions about  Answer Assessment - Initial Assessment Questions 1. COVID-19 DIAGNOSIS: "Who made your Coronavirus (COVID-19) diagnosis?" "Was it confirmed by a positive lab test?" If not diagnosed by a HCP, ask "Are there lots of cases (community spread) where you live?" (See public health department website, if unsure)   * MAJOR community spread: high number of cases; numbers of cases are increasing; many people hospitalized.   * MINOR community spread: low number of cases; not increasing; few or no people hospitalized     N/A 2. ONSET: "When did the COVID-19 symptoms start?"      Sunday 3. WORST SYMPTOM: "What is your worst symptom?" (e.g., cough, fever, shortness of breath, muscle aches)     none 4. COUGH: "How bad is the cough?"       Mild 5. FEVER: "Do you have a fever?" If so, ask: "What is your temperature, how was it measured, and when did it start?"     no 6. RESPIRATORY STATUS: "Describe your breathing?" (e.g., shortness of breath, wheezing, unable to speak)      WNL 7. BETTER-SAME-WORSE: "Are you getting better, staying the same or getting worse compared to yesterday?"  If getting worse, ask, "In what way?"     Better 8. HIGH RISK DISEASE: "Do you have any chronic medical problems?"  (e.g., asthma, heart or lung disease, weak immune system, etc.)     *No Answer*  10. OTHER SYMPTOMS: "Do you have any other symptoms?"  (e.g., runny nose, headache, sore throat, loss of smell)      Sneezing, slight headache  Protocols used: CORONAVIRUS (COVID-19) DIAGNOSED OR SUSPECTED-A-AH

## 2018-08-14 NOTE — Telephone Encounter (Signed)
Needs tele med or webex apt.

## 2018-08-16 ENCOUNTER — Encounter (HOSPITAL_BASED_OUTPATIENT_CLINIC_OR_DEPARTMENT_OTHER): Payer: Self-pay | Admitting: Emergency Medicine

## 2018-08-16 ENCOUNTER — Emergency Department (HOSPITAL_BASED_OUTPATIENT_CLINIC_OR_DEPARTMENT_OTHER): Payer: Commercial Managed Care - PPO

## 2018-08-16 ENCOUNTER — Other Ambulatory Visit: Payer: Self-pay

## 2018-08-16 ENCOUNTER — Emergency Department (HOSPITAL_BASED_OUTPATIENT_CLINIC_OR_DEPARTMENT_OTHER)
Admission: EM | Admit: 2018-08-16 | Discharge: 2018-08-16 | Disposition: A | Discharge status: Home / Self Care | Payer: Commercial Managed Care - PPO | Attending: Emergency Medicine | Admitting: Emergency Medicine

## 2018-08-16 DIAGNOSIS — J111 Influenza due to unidentified influenza virus with other respiratory manifestations: Secondary | ICD-10-CM | POA: Diagnosis not present

## 2018-08-16 DIAGNOSIS — R070 Pain in throat: Secondary | ICD-10-CM | POA: Diagnosis not present

## 2018-08-16 DIAGNOSIS — R509 Fever, unspecified: Secondary | ICD-10-CM | POA: Diagnosis present

## 2018-08-16 DIAGNOSIS — R69 Illness, unspecified: Secondary | ICD-10-CM

## 2018-08-16 DIAGNOSIS — M7918 Myalgia, other site: Secondary | ICD-10-CM | POA: Diagnosis not present

## 2018-08-16 DIAGNOSIS — R05 Cough: Secondary | ICD-10-CM | POA: Insufficient documentation

## 2018-08-16 MED ORDER — BENZONATATE 100 MG PO CAPS: 100.0000 mg | ORAL_CAPSULE | Freq: Three times a day (TID) | ORAL | 0 refills | Status: DC | PRN

## 2018-08-16 MED ORDER — ALBUTEROL SULFATE HFA 108 (90 BASE) MCG/ACT IN AERS: 1.0000 | INHALATION_SPRAY | Freq: Four times a day (QID) | RESPIRATORY_TRACT | 0 refills | Status: AC | PRN

## 2018-08-16 MED ORDER — ACETAMINOPHEN 500 MG PO TABS
1000.0000 mg | ORAL_TABLET | Freq: Once | ORAL | Status: AC
  Administered 2018-08-16: 1000 mg via ORAL
  Filled 2018-08-16: qty 2

## 2018-08-16 MED ORDER — FLUTICASONE PROPIONATE 50 MCG/ACT NA SUSP: 2.0000 | Freq: Every day | NASAL | 0 refills | Status: AC

## 2018-08-16 MED ORDER — METHOCARBAMOL 500 MG PO TABS: 500.0000 mg | ORAL_TABLET | Freq: Three times a day (TID) | ORAL | 0 refills | Status: AC | PRN

## 2018-08-16 MED ORDER — IBUPROFEN 800 MG PO TABS: 800.0000 mg | ORAL_TABLET | Freq: Three times a day (TID) | ORAL | 0 refills | Status: DC | PRN

## 2018-08-16 NOTE — ED Notes (Signed)
X-ray at bedside

## 2018-08-16 NOTE — Discharge Instructions (Signed)
You were diagnosed with the flu-like illness.  You will feel ill for as much as a few weeks.  Please take any prescribed medications as instructed, and you may use over-the-counter Tylenol and/or ibuprofen as needed according to label instructions (unless you have an allergy to either or have been told by your doctor not to take them).  You need to stay home and in isolation at least for the next week.  Follow up with your physician as instructed above, and return to the Emergency Department (ED) if you are unable to tolerate fluids due to vomiting, have worsening trouble breathing, become extremely tired or difficult to awaken, or if you develop any other symptoms that concern you.

## 2018-08-16 NOTE — ED Notes (Signed)
ED Provider at bedside. 
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
## 2018-08-16 NOTE — ED Notes (Signed)
Pt ambulated around room- SpO2 remained above 97%. HR maintained at 84

## 2018-08-16 NOTE — ED Triage Notes (Signed)
Pt states for apprx 1 week he has been sneezing, then coughing, then sore throat, and constant headache. Body aches for 3 days and joint stiffness. Pt states 100.0 fever 2 days ago. Been taking tea, ginger, tried thera flu 2 days ago without relief.

## 2018-08-17 ENCOUNTER — Encounter: Payer: Self-pay | Admitting: Emergency Medicine

## 2018-08-17 ENCOUNTER — Telehealth: Payer: Self-pay | Admitting: *Deleted

## 2018-08-17 ENCOUNTER — Telehealth (INDEPENDENT_AMBULATORY_CARE_PROVIDER_SITE_OTHER): Payer: Self-pay | Admitting: Emergency Medicine

## 2018-08-17 DIAGNOSIS — M791 Myalgia, unspecified site: Secondary | ICD-10-CM | POA: Insufficient documentation

## 2018-08-17 DIAGNOSIS — J988 Other specified respiratory disorders: Secondary | ICD-10-CM

## 2018-08-17 DIAGNOSIS — R059 Cough, unspecified: Secondary | ICD-10-CM | POA: Insufficient documentation

## 2018-08-17 DIAGNOSIS — R05 Cough: Secondary | ICD-10-CM | POA: Insufficient documentation

## 2018-08-17 DIAGNOSIS — Z7189 Other specified counseling: Secondary | ICD-10-CM

## 2018-08-17 DIAGNOSIS — B9789 Other viral agents as the cause of diseases classified elsewhere: Secondary | ICD-10-CM

## 2018-08-17 MED ORDER — NABUMETONE 750 MG PO TABS: 750.0000 mg | ORAL_TABLET | Freq: Every day | ORAL | 2 refills | Status: DC

## 2018-08-17 NOTE — Telephone Encounter (Signed)
Called patient yo triage for Webx appointment. Left message in mobile voice mail to call back.

## 2018-08-17 NOTE — Progress Notes (Signed)
Virtual Visit via Video Note  I connected with Cody Frank on 08/17/18 at  2:00 PM EDT by a video enabled telemedicine application and verified that I am speaking with the correct person using two identifiers.   I discussed the limitations of evaluation and management by telemedicine and the availability of in person appointments. The patient expressed understanding and agreed to proceed.  History of Present Illness: 48 year old man has been sick for 1 week with flulike symptoms.  Complaining of sneezing, headache, sore throat, severe body aches, and nonproductive cough.  Today mostly complaining of generalized severe achiness.  Requesting prescription for Relafen.  Emergency room note as follows:   Cody Frank is a 48 y.o. male with PMH of GERD, HLD, and seasonal allergies presents to the emergency department with flulike symptoms over the past week.  Patient has had fever, cough, sore throat, headache, body aches.  Symptoms of been persistent.  He had 2 episodes of nonbloody diarrhea.  He denies focal abdominal pain.  No chest pain.  He is feeling somewhat short of breath.  Denies history of asthma or smoking.  He has been being at home with family and generally out of the public.  He has children at home who are not sick.  He has been managing at home with Goody's, Tylenol, and Motrin.  He states that his headache is persistent and diffuse.  Denies any neck pain or stiffness.  No radiation of symptoms or other modifying factors.  Cody Frank was evaluated in Emergency Department on 08/16/2018 for the symptoms described in the history of present illness. He was evaluated in the context of the global COVID-19 pandemic, which necessitated consideration that the patient might be at risk for infection with the SARS-CoV-2 virus that causes COVID-19. Institutional protocols and algorithms that pertain to the evaluation of patients at risk for COVID-19 are in a state of rapid change based on information  released by regulatory bodies including the CDC and federal and state organizations. These policies and algorithms were followed during the patient's care in the ED.   Review of Systems  Constitutional: Positive for chills, fever and malaise/fatigue.  HENT: Positive for sore throat.   Eyes: Negative.   Respiratory: Positive for cough.   Cardiovascular: Negative for chest pain and palpitations.  Gastrointestinal: Negative for abdominal pain, diarrhea, nausea and vomiting.  Musculoskeletal: Positive for back pain, joint pain and myalgias.  Skin: Negative for rash.  Neurological: Positive for headaches. Negative for dizziness and loss of consciousness.   Observations/Objective: Awake and oriented x3 in no apparent respiratory distress.  Assessment and Plan:  Diagnoses and all orders for this visit:  Viral respiratory infection  Cough  Myalgia  Advice Given About 2019 Novel Coronavirus Infection  Other orders -     nabumetone (RELAFEN) 750 MG tablet; Take 1 tablet (750 mg total) by mouth daily.    Coronavirus precautions given  Follow Up Instructions:    I discussed the assessment and treatment plan with the patient. The patient was provided an opportunity to ask questions and all were answered. The patient agreed with the plan and demonstrated an understanding of the instructions.   The patient was advised to call back or seek an in-person evaluation if the symptoms worsen or if the condition fails to improve as anticipated.  I provided 25 minutes of non-face-to-face time during this encounter.   Georgina Quint, [place]

## 2018-08-18 ENCOUNTER — Other Ambulatory Visit: Payer: Self-pay

## 2018-08-19 ENCOUNTER — Telehealth: Payer: Self-pay | Admitting: *Deleted

## 2018-08-19 NOTE — Telephone Encounter (Signed)
Returned call to [phone], thought possibly to be brother of pt?  L/m.  Unable to discuss patient d/t HIPPA but if s/s of virus are worsening, pt should go directly to ED.  Provided clinic number for c/b if necessary.

## 2018-08-19 NOTE — Telephone Encounter (Signed)
Call to pt advised of call from MD in [place].  Pt states he is "no better" than he was at recent MD visit and ED visit.  States some times he is better and sometimes he feels worse.  Does report SOB at times when walking around house.  Referred to ED for further evaluation and pt agrees to go.  Advised to c/b if he has any other questions or concerns.

## 2018-08-19 NOTE — Telephone Encounter (Signed)
Entered in error

## 2018-08-19 NOTE — Telephone Encounter (Signed)
I spoke to Dr Leretha Pol and routed the message to her about Dr Ileene Rubens call concerning the patient, since Dr Alvy Bimler was out of the office for the week. Dr Leretha Pol reviewed the patient's chart, then I spoke to [facility] about it and I gave her the doctor's phone number. She called the number unable to reach Dr Ileene Rubens and left a voice mail message for him to call back.

## 2018-08-20 ENCOUNTER — Encounter (HOSPITAL_COMMUNITY): Payer: Self-pay

## 2018-08-20 ENCOUNTER — Other Ambulatory Visit: Payer: Self-pay

## 2018-08-20 ENCOUNTER — Observation Stay (HOSPITAL_COMMUNITY)
Admission: EM | Admit: 2018-08-20 | Discharge: 2018-08-21 | Disposition: A | Discharge status: Home / Self Care | Payer: Commercial Managed Care - PPO | Attending: Internal Medicine | Admitting: Internal Medicine

## 2018-08-20 ENCOUNTER — Emergency Department (HOSPITAL_COMMUNITY): Payer: Commercial Managed Care - PPO

## 2018-08-20 DIAGNOSIS — K219 Gastro-esophageal reflux disease without esophagitis: Secondary | ICD-10-CM | POA: Insufficient documentation

## 2018-08-20 DIAGNOSIS — R51 Headache: Secondary | ICD-10-CM

## 2018-08-20 DIAGNOSIS — Z791 Long term (current) use of non-steroidal anti-inflammatories (NSAID): Secondary | ICD-10-CM | POA: Diagnosis not present

## 2018-08-20 DIAGNOSIS — R519 Headache, unspecified: Secondary | ICD-10-CM

## 2018-08-20 DIAGNOSIS — D649 Anemia, unspecified: Secondary | ICD-10-CM | POA: Diagnosis present

## 2018-08-20 DIAGNOSIS — M549 Dorsalgia, unspecified: Secondary | ICD-10-CM | POA: Insufficient documentation

## 2018-08-20 DIAGNOSIS — Z7951 Long term (current) use of inhaled steroids: Secondary | ICD-10-CM | POA: Insufficient documentation

## 2018-08-20 DIAGNOSIS — D509 Iron deficiency anemia, unspecified: Secondary | ICD-10-CM | POA: Insufficient documentation

## 2018-08-20 DIAGNOSIS — G8929 Other chronic pain: Secondary | ICD-10-CM | POA: Insufficient documentation

## 2018-08-20 DIAGNOSIS — Z79899 Other long term (current) drug therapy: Secondary | ICD-10-CM | POA: Diagnosis not present

## 2018-08-20 DIAGNOSIS — E785 Hyperlipidemia, unspecified: Secondary | ICD-10-CM | POA: Insufficient documentation

## 2018-08-20 DIAGNOSIS — R197 Diarrhea, unspecified: Secondary | ICD-10-CM | POA: Insufficient documentation

## 2018-08-20 DIAGNOSIS — J988 Other specified respiratory disorders: Secondary | ICD-10-CM

## 2018-08-20 LAB — COMPREHENSIVE METABOLIC PANEL
ALT: 47 U/L — ABNORMAL HIGH (ref 0–44)
AST: 73 U/L — ABNORMAL HIGH (ref 15–41)
Albumin: 4 g/dL (ref 3.5–5.0)
Alkaline Phosphatase: 65 U/L (ref 38–126)
Anion gap: 10 (ref 5–15)
BUN: 11 mg/dL (ref 6–20)
CO2: 25 mmol/L (ref 22–32)
Calcium: 8.8 mg/dL — ABNORMAL LOW (ref 8.9–10.3)
Chloride: 98 mmol/L (ref 98–111)
Creatinine, Ser: 1.02 mg/dL (ref 0.61–1.24)
GFR calc Af Amer: >60 mL/min (ref >60)
GFR calc non Af Amer: >60 mL/min (ref >60)
Glucose, Bld: 115 mg/dL — ABNORMAL HIGH (ref 70–99)
Potassium: 4 mmol/L (ref 3.5–5.1)
Sodium: 133 mmol/L — ABNORMAL LOW (ref 135–145)
Total Bilirubin: 1 mg/dL (ref 0.3–1.2)
Total Protein: 8.3 g/dL — ABNORMAL HIGH (ref 6.5–8.1)

## 2018-08-20 LAB — CBC WITH DIFFERENTIAL/PLATELET
Abs Immature Granulocytes: 0.05 10*3/uL (ref 0.00–0.07)
Basophils Absolute: 0 10*3/uL (ref 0.0–0.1)
Basophils Relative: 0 %
Eosinophils Absolute: 0.1 10*3/uL (ref 0.0–0.5)
Eosinophils Relative: 1 %
HCT: 41.6 % (ref 39.0–52.0)
Hemoglobin: 12.8 g/dL — ABNORMAL LOW (ref 13.0–17.0)
Immature Granulocytes: 1 %
Lymphocytes Relative: 20 %
Lymphs Abs: 1 10*3/uL (ref 0.7–4.0)
MCH: 22.6 pg — ABNORMAL LOW (ref 26.0–34.0)
MCHC: 30.8 g/dL (ref 30.0–36.0)
MCV: 73.5 fL — ABNORMAL LOW (ref 80.0–100.0)
Monocytes Absolute: 0.4 10*3/uL (ref 0.1–1.0)
Monocytes Relative: 7 %
Neutro Abs: 3.7 10*3/uL (ref 1.7–7.7)
Neutrophils Relative %: 71 %
Platelets: 210 10*3/uL (ref 150–400)
RBC: 5.66 MIL/uL (ref 4.22–5.81)
RDW: 12.8 % (ref 11.5–15.5)
WBC: 5.2 10*3/uL (ref 4.0–10.5)
nRBC: 0 % (ref 0.0–0.2)

## 2018-08-20 LAB — LACTIC ACID, PLASMA: Lactic Acid, Venous: 1 mmol/L (ref 0.5–1.9)

## 2018-08-20 LAB — SARS CORONAVIRUS 2 BY RT PCR (HOSPITAL ORDER, PERFORMED IN ~~LOC~~ HOSPITAL LAB): SARS Coronavirus 2: POSITIVE — AB

## 2018-08-20 MED ORDER — SODIUM CHLORIDE 0.9 % IV SOLN
500.0000 mg | Freq: Once | INTRAVENOUS | Status: AC
  Administered 2018-08-20: 500 mg via INTRAVENOUS
  Filled 2018-08-20: qty 500

## 2018-08-20 MED ORDER — SODIUM CHLORIDE 0.9 % IV SOLN
1000.0000 mL | INTRAVENOUS | Status: DC
  Administered 2018-08-20: 1000 mL via INTRAVENOUS

## 2018-08-20 MED ORDER — LEVOFLOXACIN IN D5W 750 MG/150ML IV SOLN: 750.0000 mg | Freq: Once | INTRAVENOUS | Status: DC

## 2018-08-20 MED ORDER — SODIUM CHLORIDE 0.9 % IV SOLN
1.0000 g | Freq: Once | INTRAVENOUS | Status: AC
  Administered 2018-08-20: 1 g via INTRAVENOUS
  Filled 2018-08-20: qty 10

## 2018-08-20 NOTE — ED Triage Notes (Addendum)
Per EMS, patient is complaining of SOB and cough for the last 10 days. Per patient, he was tested for covid-19 yesterday and received positive results today. Patient states that he went to a private office to receive test but does not remember name of office.   Patient had worsening SOB today and his SpO2 was 93% when EMS arrived on scene. Patient was placed on non-rebreather at 10 L. Patient SpO2 is 100% on non-rebreather and is breathing 20-22 breaths per minute.

## 2018-08-20 NOTE — Progress Notes (Signed)
A consult was received from an ED physician for rocephin/zmax per pharmacy dosing.  The patient's profile has been reviewed for ht/wt/allergies/indication/available labs.   A one time order has been placed for rocephin 1gm and zmax 500mg .  Further antibiotics/pharmacy consults should be ordered by admitting physician if indicated.        Patient has tolerated ceftin in past.                   Thank you, Lorenza Evangelist 08/20/2018  9:57 PM

## 2018-08-20 NOTE — H&P (Addendum)
History and Physical    [name] ZOX:[id number] DOB: Jan 27, 1971 DOA: 08/20/2018  PCP: Patient, No Pcp Per Patient coming from: Home  Chief Complaint: Shortness of breath, cough  HPI: Cody Frank is a 48 y.o. male with medical history significant of GERD, hyperlipidemia, allergic rhinitis, eczema, low back pain presenting to the hospital for evaluation of shortness of breath and cough. SPO2 93% per EMS and was placed on nonrebreather at 10 L.  Placed on 2 L supplemental oxygen in the ED and maintaining oxygen saturation in the high 90s.  Patient reports 4 to 5-day history of fevers, chills, dyspnea, nonproductive cough, body aches, and generalized headaches.  He is also having watery, nonbloody diarrhea for the past 3 days.  Denies any chest pain, abdominal pain, nausea, or vomiting.  States he was seen at a doctor's office in [facility] yesterday and COVID testing was done.  He was called today to inform him that he tested positive.  Review of Systems: As per HPI otherwise 10 point review of systems negative.  Past Medical History:  Diagnosis Date  . Allergic rhinitis   . Eczema   . GERD (gastroesophageal reflux disease)   . Headache(784.0)   . History of chicken pox    Titered on 10/22/2010  . Hoarseness   . Hyperlipidemia   . Low back pain syndrome    Rx'ed MRI by Dr. Newell Coral    Past Surgical History:  Procedure Laterality Date  . NO PAST SURGERIES       reports that he has never smoked. He has never used smokeless tobacco. He reports that he does not drink alcohol or use drugs.  Allergies  Allergen Reactions  . Amoxicillin Nausea And Vomiting    Family History  Problem Relation Age of Onset  . CAD Neg Hx   . Diabetes Neg Hx   . Colon cancer Neg Hx   . Prostate cancer Neg Hx     Prior to Admission medications   Medication Sig Start Date End Date Taking? Authorizing Provider  albuterol (PROVENTIL HFA;VENTOLIN HFA) 108 (90 Base) MCG/ACT inhaler Inhale 1-2 puffs  into the lungs every 6 (six) hours as needed for wheezing or shortness of breath. 08/16/18  Yes Long, Arlyss Repress, MD  benzonatate (TESSALON) 100 MG capsule Take 1 capsule (100 mg total) by mouth 3 (three) times daily as needed for cough. 08/16/18  Yes Long, Arlyss Repress, MD  fluticasone (FLONASE) 50 MCG/ACT nasal spray Place 2 sprays into both nostrils daily for 7 days. 08/16/18 08/23/18 Yes Long, Arlyss Repress, MD  methocarbamol (ROBAXIN) 500 MG tablet Take 1 tablet (500 mg total) by mouth every 8 (eight) hours as needed for muscle spasms. 08/16/18  Yes Long, Arlyss Repress, MD  nabumetone (RELAFEN) 750 MG tablet Take 1 tablet (750 mg total) by mouth daily. 08/17/18  Yes Sagardia, Eilleen Kempf, MD  vitamin C (ASCORBIC ACID) 250 MG tablet Take 250 mg by mouth daily.   Yes [provider]  ibuprofen (ADVIL,MOTRIN) 800 MG tablet Take 1 tablet (800 mg total) by mouth every 8 (eight) hours as needed for moderate pain or cramping. 08/16/18   Long, Arlyss Repress, MD  naproxen (NAPROSYN) 375 MG tablet Take 1 tablet (375 mg total) by mouth 2 (two) times daily. Patient not taking: Reported on 08/20/2018 11/24/17   Petrucelli, Pleas Koch, PA-C  pantoprazole (PROTONIX) 40 MG tablet Take 1 tablet (40 mg total) by mouth daily before breakfast. Patient not taking: Reported on 08/20/2018 09/30/17  Georgina QuintSagardia, Miguel Jose, MD    Physical Exam: Vitals:   08/20/18 2200 08/20/18 2311 08/21/18 0000 08/21/18 0124  BP: 123/69 127/79 118/79 117/75  Pulse: 73 77 78 77  Resp: 15 (!) 22 (!) 24 (!) 25  Temp:      TempSrc:      SpO2: 100% 100% 99% 98%  Weight:      Height:        Physical Exam  Constitutional: He is oriented to person, place, and time. He appears well-developed and well-nourished.  Appears ill  HENT:  Head: Normocephalic.  Eyes: Right eye exhibits no discharge. Left eye exhibits no discharge.  Neck: Neck supple.  Cardiovascular: Normal rate, regular rhythm and intact distal pulses.  Pulmonary/Chest: Effort normal. He  has no wheezes.  Coughing Mild crackles appreciated at the bases  Abdominal: Soft. Bowel sounds are normal. He exhibits no distension. There is no abdominal tenderness. There is no rebound and no guarding.  Musculoskeletal:        General: No edema.  Neurological: He is alert and oriented to person, place, and time. No cranial nerve deficit.  Strength 5 out of 5 in bilateral upper and lower extremities. Sensation to light touch intact throughout.  Skin: Skin is warm and dry. He is not diaphoretic.     Labs on Admission: I have personally reviewed following labs and imaging studies  CBC: Recent Labs  Lab 08/20/18 2116  WBC 5.2  NEUTROABS 3.7  HGB 12.8*  HCT 41.6  MCV 73.5*  PLT 210   Basic Metabolic Panel: Recent Labs  Lab 08/20/18 2116 08/21/18 0010  NA 133*  --   K 4.0  --   CL 98  --   CO2 25  --   GLUCOSE 115*  --   BUN 11  --   CREATININE 1.02  --   CALCIUM 8.8*  --   MG  --  2.4   GFR: Estimated Creatinine Clearance: 91.4 mL/min (by C-G formula based on SCr of 1.02 mg/dL). Liver Function Tests: Recent Labs  Lab 08/20/18 2116  AST 73*  ALT 47*  ALKPHOS 65  BILITOT 1.0  PROT 8.3*  ALBUMIN 4.0   No results for input(s): LIPASE, AMYLASE in the last 168 hours. No results for input(s): AMMONIA in the last 168 hours. Coagulation Profile: No results for input(s): INR, PROTIME in the last 168 hours. Cardiac Enzymes: Recent Labs  Lab 08/21/18 0010  TROPONINI <0.03   BNP (last 3 results) No results for input(s): PROBNP in the last 8760 hours. HbA1C: No results for input(s): HGBA1C in the last 72 hours. CBG: No results for input(s): GLUCAP in the last 168 hours. Lipid Profile: No results for input(s): CHOL, HDL, LDLCALC, TRIG, CHOLHDL, LDLDIRECT in the last 72 hours. Thyroid Function Tests: No results for input(s): TSH, T4TOTAL, FREET4, T3FREE, THYROIDAB in the last 72 hours. Anemia Panel: No results for input(s): VITAMINB12, FOLATE, FERRITIN, TIBC,  IRON, RETICCTPCT in the last 72 hours. Urine analysis:    Component Value Date/Time   COLORURINE YELLOW 04/09/2011 1530   APPEARANCEUR CLEAR 04/09/2011 1530   LABSPEC 1.027 04/09/2011 1530   PHURINE 7.0 04/09/2011 1530   GLUCOSEU NEG 04/09/2011 1530   HGBUR NEG 04/09/2011 1530   BILIRUBINUR NEG 04/09/2011 1530   KETONESUR NEG 04/09/2011 1530   PROTEINUR NEG 04/09/2011 1530   UROBILINOGEN 1 04/09/2011 1530   NITRITE NEG 04/09/2011 1530   LEUKOCYTESUR NEG 04/09/2011 1530    Radiological Exams on Admission: Dg  Chest Port 1 View  Result Date: 08/20/2018 CLINICAL DATA:  Shortness of breath and cough for 10 days. Positive test for COVID-19. EXAM: PORTABLE CHEST 1 VIEW COMPARISON:  08/16/2018 FINDINGS: Telemetry leads overlie the chest. The cardiomediastinal silhouette is within normal limits for portable AP technique. The lungs are less well inflated than on the prior study. The interstitial markings are slightly increased bilaterally compared to the prior study, and there is minimal asymmetric opacity in the left perihilar region and left lung base. No frank airspace consolidation is evident. No pleural effusion or pneumothorax is identified. No acute osseous abnormality is seen. IMPRESSION: Mildly increased interstitial type densities in the left greater than right lungs. Electronically Signed   By: Sebastian Ache M.D.   On: 08/20/2018 21:37    EKG: Independently reviewed.  Sinus rhythm, nonspecific T wave abnormality in lead III, repolarization abnormality in V2 and V3.  QTc 430.  Assessment/Plan Principal Problem:   COVID-19 virus infection Active Problems:   Headache   Back pain   Anemia   Diarrhea   COVID-19 positive -Per patient, outpatient COVID-19 testing done yesterday positive. Now presenting with worsening shortness of breath. -SPO2 93% per EMS. Placed on 2 L supplemental oxygen in the ED and maintaining oxygen saturation in the high 90s. -Repeat SARS-CoV-2 testing done in  the ED positive.  Afebrile.  No leukocytosis.  No lymphopenia.  Lactic acid normal.  Transaminases mildly elevated.  LDH and fibrinogen elevated. D-dimer 1.36. Troponin negative. -Chest x-ray showing mildly increased interstitial type densities in the left greater than right lungs. -Received ceftriaxone and azithromycin in the ED.  Check procalcitonin level now.  Continue antibiotics if procalcitonin elevated. -Labs pending at this time: CRP and ferritin -Daily monitoring: EKG for QTC, mag, potassium, LFTs, CBC with differential, BMP, troponin, CRP, ferritin, fibrinogen, d-dimer -D-Dimer elevated. Stat CTA to rule out PE.  -Hydrochloroquine 400 mg twice daily x2 doses, then Hydrocholoquine 200 mg twice daily x4 days -Antitussives: Robitussin-DM as needed, Tussionex as needed -Vitamin C p.o. 500 mg twice daily -Zinc p.o.220 mg daily -Vitamin D3 2000 IU daily -Lovenox 40 mg subcutaneous daily -Tylenol PRN -Droplet and contact precautions, eye protection -Continue supplemental oxygen.  Monitor closely. -Blood culture x2 pending  Diarrhea Likely related to viral illness.  No abdominal pain, nausea, or vomiting.  Abdominal exam benign. -Gentle IV fluid hydration.  Maintain euvolemic status. -Encourage p.o. intake -Loperamide PRN  Generalized headaches Likely related to viral illness.  No focal neuro deficit. -Tylenol PRN  Chronic microcytic anemia -Stable.  Hemoglobin 12.8, at baseline. -Check iron, ferritin, TIBC  Chronic back pain -Stable.  Tramadol 50 mg every 6 hours as needed.  Tylenol PRN.  DVT prophylaxis: Lovenox Family Communication: No family available. Disposition Plan: Anticipate discharge after clinical improvement. Consults called: None Admission status: It is my clinical opinion that admission to INPATIENT is reasonable and necessary in this 48 y.o. male . presenting with respiratory distress secondary to COVID-19 infection . Workup and treatment mentioned above.   Given the aforementioned, the predictability of an adverse outcome is felt to be significant. I expect that the patient will require at least 2 midnights in the hospital to treat this condition.   This chart was dictated using voice recognition software.  Despite best efforts to proofread, errors can occur which can change the documentation meaning.  John Giovanni MD [facility] Pager [phone]  If 7PM-7AM, please contact night-coverage [email or web address] Password [email or web address]  08/21/2018, 1:32 AM

## 2018-08-20 NOTE — ED Notes (Signed)
Patient taken off of non-rebreather per Dr. Freida Busman and placed on 2 L of O2 via [place]. SpO2 97%.

## 2018-08-20 NOTE — ED Notes (Signed)
Bed: CE02 Expected date:  Expected time:  Means of arrival:  Comments: EMS 48 yo male Positive Covid/SOB/coughing

## 2018-08-20 NOTE — ED Notes (Signed)
Patient made aware that urine sample is needed. Urinal at bedside.  

## 2018-08-20 NOTE — ED Provider Notes (Signed)
[facility]-EMERGENCY DEPT Provider Note   CSN: [id number] Arrival date & time: 08/20/18  2011    History   Chief Complaint Chief Complaint  Patient presents with  . Positive Covid  . Shortness of Breath    HPI Cody Frank is a 48 y.o. male.     48 year old male who presents with increased shortness of breath.  Per patient he was told today he was diagnosed with COVID-19.  States he has had nonproductive cough with fever at home.  No vomiting or diarrhea.  No chest pain or chest pressure.  Called EMS was transported here     Past Medical History:  Diagnosis Date  . Allergic rhinitis   . Eczema   . GERD (gastroesophageal reflux disease)   . Headache(784.0)   . History of chicken pox    Titered on 10/22/2010  . Hoarseness   . Hyperlipidemia   . Low back pain syndrome    Rx'ed MRI by Dr. Newell Coral    Patient Active Problem List   Diagnosis Date Noted  . Viral respiratory infection 08/17/2018  . Cough 08/17/2018  . Myalgia 08/17/2018  . Advice Given About 2019 Novel Coronavirus Infection 08/17/2018  . Anemia 04/26/2014  . 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 GERD (gastroesophageal reflux disease)   . Hyperlipidemia 04/02/2010    Past Surgical History:  Procedure Laterality Date  . NO PAST SURGERIES          Home Medications    Prior to Admission medications   Medication Sig Start Date End Date Taking? Authorizing Provider  albuterol (PROVENTIL HFA;VENTOLIN HFA) 108 (90 Base) MCG/ACT inhaler Inhale 1-2 puffs into the lungs every 6 (six) hours as needed for wheezing or shortness of breath. 08/16/18   Long, Arlyss Repress, MD  benzonatate (TESSALON) 100 MG capsule Take 1 capsule (100 mg total) by mouth 3 (three) times daily as needed for cough. 08/16/18   Long, Arlyss Repress, MD  fluticasone (FLONASE) 50 MCG/ACT nasal spray Place 2 sprays into both nostrils daily for 7 days. 08/16/18 08/23/18  Long, Arlyss Repress, MD  ibuprofen (ADVIL,MOTRIN) 800 MG tablet Take 1  tablet (800 mg total) by mouth every 8 (eight) hours as needed for moderate pain or cramping. 08/16/18   Long, Arlyss Repress, MD  methocarbamol (ROBAXIN) 500 MG tablet Take 1 tablet (500 mg total) by mouth every 8 (eight) hours as needed for muscle spasms. 08/16/18   Long, Arlyss Repress, MD  nabumetone (RELAFEN) 750 MG tablet Take 1 tablet (750 mg total) by mouth daily. 08/17/18   Georgina Quint, MD  naproxen (NAPROSYN) 375 MG tablet Take 1 tablet (375 mg total) by mouth 2 (two) times daily. 11/24/17   Petrucelli, Pleas Koch, PA-C  pantoprazole (PROTONIX) 40 MG tablet Take 1 tablet (40 mg total) by mouth daily before breakfast. 09/30/17   Georgina Quint, MD    Family History Family History  Problem Relation Age of Onset  . CAD Neg Hx   . Diabetes Neg Hx   . Colon cancer Neg Hx   . Prostate cancer Neg Hx     Social History Social History   Tobacco Use  . Smoking status: Never Smoker  . Smokeless tobacco: Never Used  Substance Use Topics  . Alcohol use: No  . Drug use: No     Allergies   Amoxicillin   Review of Systems Review of Systems  All other systems reviewed and are negative.    Physical Exam Updated Vital Signs  BP 118/72 (BP Location: Right Arm)   Pulse 84   Temp 99.8 F (37.7 C) (Oral)   Resp 20   Ht 1.778 m (5\' 10" )   Wt 80.7 kg   SpO2 98%   BMI 25.54 kg/m   Physical Exam Vitals signs and nursing note reviewed.  Constitutional:      General: He is not in acute distress.    Appearance: Normal appearance. He is well-developed. He is not toxic-appearing.  HENT:     Head: Normocephalic and atraumatic.  Eyes:     General: Lids are normal.     Conjunctiva/sclera: Conjunctivae normal.     Pupils: Pupils are equal, round, and reactive to light.  Neck:     Musculoskeletal: Normal range of motion and neck supple.     Thyroid: No thyroid mass.     Trachea: No tracheal deviation.  Cardiovascular:     Rate and Rhythm: Normal rate and regular rhythm.      Heart sounds: Normal heart sounds. No murmur. No gallop.   Pulmonary:     Effort: Pulmonary effort is normal. No respiratory distress.     Breath sounds: Normal breath sounds. No stridor. No decreased breath sounds, wheezing, rhonchi or rales.  Abdominal:     General: Bowel sounds are normal. There is no distension.     Palpations: Abdomen is soft.     Tenderness: There is no abdominal tenderness. There is no rebound.  Musculoskeletal: Normal range of motion.        General: No tenderness.  Skin:    General: Skin is warm and dry.     Findings: No abrasion or rash.  Neurological:     Mental Status: He is alert and oriented to person, place, and time.     GCS: GCS eye subscore is 4. GCS verbal subscore is 5. GCS motor subscore is 6.     Cranial Nerves: No cranial nerve deficit.     Sensory: No sensory deficit.  Psychiatric:        Speech: Speech normal.        Behavior: Behavior normal.      ED Treatments / Results  Labs (all labs ordered are listed, but only abnormal results are displayed) Labs Reviewed  CULTURE, BLOOD (ROUTINE X 2)  CULTURE, BLOOD (ROUTINE X 2)  SARS CORONAVIRUS 2 (HOSPITAL ORDER, PERFORMED IN [facility])  LACTIC ACID, PLASMA  LACTIC ACID, PLASMA  COMPREHENSIVE METABOLIC PANEL  CBC WITH DIFFERENTIAL/PLATELET  URINALYSIS, ROUTINE W REFLEX MICROSCOPIC    EKG None  Radiology No results found.  Procedures Procedures (including critical care time)  Medications Ordered in ED Medications  0.9 %  sodium chloride infusion (has no administration in time range)     Initial Impression / Assessment and Plan / ED Course  I have reviewed the triage vital signs and the nursing notes.  Pertinent labs & imaging results that were available during my care of the patient were reviewed by me and considered in my medical decision making (see chart for details).        Patient's chest x-ray concerning for pneumonia.  Start on IV antibiotics.   Patient's COVID test was positive.  He has been on droplet and contact precautions.  Remains short of breath at this time.  Will admit to the hospitalist service  Cody Frank was evaluated in Emergency Department on 08/20/2018 for the symptoms described in the history of present illness. He was evaluated in the context of  the global COVID-19 pandemic, which necessitated consideration that the patient might be at risk for infection with the SARS-CoV-2 virus that causes COVID-19. Institutional protocols and algorithms that pertain to the evaluation of patients at risk for COVID-19 are in a state of rapid change based on information released by regulatory bodies including the CDC and federal and state organizations. These policies and algorithms were followed during the patient's care in the ED.  Final Clinical Impressions(s) / ED Diagnoses   Final diagnoses:  None    ED Discharge Orders    None       Lorre Nick, MD 08/20/18 2307

## 2018-08-21 ENCOUNTER — Other Ambulatory Visit: Payer: Self-pay

## 2018-08-21 ENCOUNTER — Inpatient Hospital Stay (HOSPITAL_COMMUNITY): Payer: Commercial Managed Care - PPO

## 2018-08-21 ENCOUNTER — Encounter (HOSPITAL_COMMUNITY): Payer: Self-pay

## 2018-08-21 DIAGNOSIS — K219 Gastro-esophageal reflux disease without esophagitis: Secondary | ICD-10-CM | POA: Diagnosis not present

## 2018-08-21 DIAGNOSIS — D649 Anemia, unspecified: Secondary | ICD-10-CM | POA: Diagnosis not present

## 2018-08-21 DIAGNOSIS — R197 Diarrhea, unspecified: Secondary | ICD-10-CM | POA: Diagnosis not present

## 2018-08-21 DIAGNOSIS — J988 Other specified respiratory disorders: Secondary | ICD-10-CM

## 2018-08-21 LAB — C-REACTIVE PROTEIN: CRP: 6.3 mg/dL — ABNORMAL HIGH (ref <1.0)

## 2018-08-21 LAB — BASIC METABOLIC PANEL
Anion gap: 8 (ref 5–15)
BUN: 10 mg/dL (ref 6–20)
CO2: 22 mmol/L (ref 22–32)
Calcium: 7.9 mg/dL — ABNORMAL LOW (ref 8.9–10.3)
Chloride: 103 mmol/L (ref 98–111)
Creatinine, Ser: 0.92 mg/dL (ref 0.61–1.24)
GFR calc Af Amer: >60 mL/min (ref >60)
GFR calc non Af Amer: >60 mL/min (ref >60)
Glucose, Bld: 128 mg/dL — ABNORMAL HIGH (ref 70–99)
Potassium: 3.9 mmol/L (ref 3.5–5.1)
Sodium: 133 mmol/L — ABNORMAL LOW (ref 135–145)

## 2018-08-21 LAB — LACTATE DEHYDROGENASE: LDH: 516 U/L — ABNORMAL HIGH (ref 98–192)

## 2018-08-21 LAB — TROPONIN I
Troponin I: <0.03 ng/mL (ref <0.03)
Troponin I: <0.03 ng/mL (ref <0.03)

## 2018-08-21 LAB — FIBRINOGEN: Fibrinogen: 672 mg/dL — ABNORMAL HIGH (ref 210–475)

## 2018-08-21 LAB — MAGNESIUM: Magnesium: 2.4 mg/dL (ref 1.7–2.4)

## 2018-08-21 LAB — D-DIMER, QUANTITATIVE: D-Dimer, Quant: 1.36 ug/mL-FEU — ABNORMAL HIGH (ref 0.00–0.50)

## 2018-08-21 LAB — IRON AND TIBC
Iron: 24 ug/dL — ABNORMAL LOW (ref 45–182)
Saturation Ratios: 8 % — ABNORMAL LOW (ref 17.9–39.5)
TIBC: 286 ug/dL (ref 250–450)
UIBC: 262 ug/dL

## 2018-08-21 LAB — FERRITIN: Ferritin: 1445 ng/mL — ABNORMAL HIGH (ref 24–336)

## 2018-08-21 LAB — PROCALCITONIN: Procalcitonin: <0.1 ng/mL

## 2018-08-21 MED ORDER — ENOXAPARIN SODIUM 40 MG/0.4ML ~~LOC~~ SOLN
40.0000 mg | Freq: Every day | SUBCUTANEOUS | Status: DC
  Administered 2018-08-21: 40 mg via SUBCUTANEOUS
  Filled 2018-08-21: qty 0.4

## 2018-08-21 MED ORDER — IOHEXOL 350 MG/ML SOLN
100.0000 mL | Freq: Once | INTRAVENOUS | Status: AC | PRN
  Administered 2018-08-21: 100 mL via INTRAVENOUS

## 2018-08-21 MED ORDER — GUAIFENESIN-DM 100-10 MG/5ML PO SYRP
10.0000 mL | ORAL_SOLUTION | ORAL | Status: DC | PRN
  Administered 2018-08-21: 10 mL via ORAL
  Filled 2018-08-21: qty 10

## 2018-08-21 MED ORDER — ASCORBIC ACID 500 MG PO TABS: 500.0000 mg | ORAL_TABLET | Freq: Two times a day (BID) | ORAL | Status: AC

## 2018-08-21 MED ORDER — ACETAMINOPHEN 325 MG PO TABS: 650.0000 mg | ORAL_TABLET | Freq: Four times a day (QID) | ORAL | Status: AC | PRN

## 2018-08-21 MED ORDER — VITAMIN D 25 MCG (1000 UNIT) PO TABS: 1000.0000 [IU] | ORAL_TABLET | Freq: Every day | ORAL | Status: DC

## 2018-08-21 MED ORDER — SODIUM CHLORIDE 0.9 % IV SOLN: INTRAVENOUS | Status: DC

## 2018-08-21 MED ORDER — ZINC SULFATE 220 (50 ZN) MG PO CAPS: 220.0000 mg | ORAL_CAPSULE | Freq: Every day | ORAL | Status: AC

## 2018-08-21 MED ORDER — BENZONATATE 100 MG PO CAPS: 100.0000 mg | ORAL_CAPSULE | Freq: Three times a day (TID) | ORAL | 0 refills | Status: AC | PRN

## 2018-08-21 MED ORDER — BENZONATATE 100 MG PO CAPS
100.0000 mg | ORAL_CAPSULE | Freq: Three times a day (TID) | ORAL | Status: DC
  Administered 2018-08-21 (×2): 100 mg via ORAL
  Filled 2018-08-21 (×2): qty 1

## 2018-08-21 MED ORDER — HYDROCOD POLST-CPM POLST ER 10-8 MG/5ML PO SUER
5.0000 mL | Freq: Two times a day (BID) | ORAL | Status: DC | PRN
  Administered 2018-08-21: 5 mL via ORAL
  Filled 2018-08-21: qty 5

## 2018-08-21 MED ORDER — ACETAMINOPHEN 325 MG PO TABS
650.0000 mg | ORAL_TABLET | Freq: Four times a day (QID) | ORAL | Status: DC | PRN
  Administered 2018-08-21: 650 mg via ORAL
  Filled 2018-08-21: qty 2

## 2018-08-21 MED ORDER — FUROSEMIDE 10 MG/ML IJ SOLN
40.0000 mg | Freq: Once | INTRAMUSCULAR | Status: AC
  Administered 2018-08-21: 40 mg via INTRAVENOUS
  Filled 2018-08-21: qty 4

## 2018-08-21 MED ORDER — HYDROXYCHLOROQUINE SULFATE 200 MG PO TABS
400.0000 mg | ORAL_TABLET | Freq: Two times a day (BID) | ORAL | Status: AC
  Administered 2018-08-21 (×2): 400 mg via ORAL
  Filled 2018-08-21 (×2): qty 2

## 2018-08-21 MED ORDER — HYDROXYCHLOROQUINE SULFATE 200 MG PO TABS
200.0000 mg | ORAL_TABLET | Freq: Two times a day (BID) | ORAL | Status: DC
  Administered 2018-08-21: 200 mg via ORAL
  Filled 2018-08-21: qty 1

## 2018-08-21 MED ORDER — VITAMIN D 25 MCG (1000 UNIT) PO TABS
2000.0000 [IU] | ORAL_TABLET | Freq: Every day | ORAL | Status: DC
  Administered 2018-08-21: 2000 [IU] via ORAL
  Filled 2018-08-21: qty 2

## 2018-08-21 MED ORDER — VITAMIN C 500 MG PO TABS
500.0000 mg | ORAL_TABLET | Freq: Two times a day (BID) | ORAL | Status: DC
  Administered 2018-08-21 (×3): 500 mg via ORAL
  Filled 2018-08-21 (×3): qty 1

## 2018-08-21 MED ORDER — GUAIFENESIN ER 600 MG PO TB12: 1200.0000 mg | ORAL_TABLET | Freq: Two times a day (BID) | ORAL | 0 refills | Status: AC

## 2018-08-21 MED ORDER — TRAMADOL HCL 50 MG PO TABS: 50.0000 mg | ORAL_TABLET | Freq: Four times a day (QID) | ORAL | Status: DC | PRN

## 2018-08-21 MED ORDER — LOPERAMIDE HCL 2 MG PO CAPS: 2.0000 mg | ORAL_CAPSULE | ORAL | Status: DC | PRN

## 2018-08-21 MED ORDER — ZINC SULFATE 220 (50 ZN) MG PO CAPS
220.0000 mg | ORAL_CAPSULE | Freq: Every day | ORAL | Status: DC
  Administered 2018-08-21: 220 mg via ORAL
  Filled 2018-08-21: qty 1

## 2018-08-21 MED ORDER — SODIUM CHLORIDE (PF) 0.9 % IJ SOLN
INTRAMUSCULAR | Status: AC
  Administered 2018-08-21: 02:00:00
  Filled 2018-08-21: qty 50

## 2018-08-21 MED ORDER — HYDROXYCHLOROQUINE SULFATE 200 MG PO TABS: 200.0000 mg | ORAL_TABLET | Freq: Two times a day (BID) | ORAL | 0 refills | Status: AC

## 2018-08-21 MED ORDER — SODIUM CHLORIDE 0.9 % IV SOLN
1000.0000 mL | INTRAVENOUS | Status: DC
  Administered 2018-08-21 (×2): 1000 mL via INTRAVENOUS

## 2018-08-21 NOTE — Progress Notes (Addendum)
CSW received a call from pt's ED secretary who states pt, per pt's RN, is "refusing D/C" and staff wants to verify if pt has the right to "refuse D/C".  CSW reviewed chart and sees pt does not have Medicare, but has [place] [employer] PPO.  CSW is contacting [facility]OC CM to verify that this means pt cannot refuse D/C.   CSW awaiting return call from [facility] CM on-duty at [facility].  6:27 PM CSW spoke to the [facility] CM who will call the pt and then update pt's RN Fred at ph: [phone].  CSW will continue to follow for D/C needs.  Dorothe Pea. Brandonlee Navis, LCSW, LCAS, CSI Transitions of Care Clinical Social Worker Care Coordination Department Ph: [phone]

## 2018-08-21 NOTE — Discharge Instructions (Signed)
Person Under Monitoring Name: Cody Frank  Location: [address] [place] [address]   Infection Prevention Recommendations for Individuals Confirmed to have, or Being Evaluated for, 2019 Novel Coronavirus (COVID-19) Infection Who Receive Care at Home  Individuals who are confirmed to have, or are being evaluated for, COVID-19 should follow the prevention steps below until a healthcare provider or local or state health department says they can return to normal activities.  Stay home except to get medical care You should restrict activities outside your home, except for getting medical care. Do not go to work, school, or public areas, and do not use public transportation or taxis.  Call ahead before visiting your doctor Before your medical appointment, call the healthcare provider and tell them that you have, or are being evaluated for, COVID-19 infection. This will help the healthcare providers office take steps to keep other people from getting infected. Ask your healthcare provider to call the local or state health department.  Monitor your symptoms Seek prompt medical attention if your illness is worsening (e.g., difficulty breathing). Before going to your medical appointment, call the healthcare provider and tell them that you have, or are being evaluated for, COVID-19 infection. Ask your healthcare provider to call the local or state health department.  Wear a facemask You should wear a facemask that covers your nose and mouth when you are in the same room with other people and when you visit a healthcare provider. People who live with or visit you should also wear a facemask while they are in the same room with you.  Separate yourself from other people in your home As much as possible, you should stay in a different room from other people in your home. Also, you should use a separate bathroom, if available.  Avoid sharing household items You should not  share dishes, drinking glasses, cups, eating utensils, towels, bedding, or other items with other people in your home. After using these items, you should wash them thoroughly with soap and water.  Cover your coughs and sneezes Cover your mouth and nose with a tissue when you cough or sneeze, or you can cough or sneeze into your sleeve. Throw used tissues in a lined trash can, and immediately wash your hands with soap and water for at least 20 seconds or use an alcohol-based hand rub.  Wash your [employer] your hands often and thoroughly with soap and water for at least 20 seconds. You can use an alcohol-based hand sanitizer if soap and water are not available and if your hands are not visibly dirty. Avoid touching your eyes, nose, and mouth with unwashed hands.   Prevention Steps for Caregivers and Household Members of Individuals Confirmed to have, or Being Evaluated for, COVID-19 Infection Being Cared for in the Home  If you live with, or provide care at home for, a person confirmed to have, or being evaluated for, COVID-19 infection please follow these guidelines to prevent infection:  Follow healthcare providers instructions Make sure that you understand and can help the patient follow any healthcare provider instructions for all care.  Provide for the patients basic needs You should help the patient with basic needs in the home and provide support for getting groceries, prescriptions, and other personal needs.  Monitor the patients symptoms If they are getting sicker, call his or her medical provider and tell them that the patient has, or is being evaluated for, COVID-19 infection. This will help the healthcare providers office  take steps to keep other people from getting infected. Ask the healthcare provider to call the local or state health department.  Limit the number of people who have contact with the patient  If possible, have only one caregiver for the  patient.  Other household members should stay in another home or place of residence. If this is not possible, they should stay  in another room, or be separated from the patient as much as possible. Use a separate bathroom, if available.  Restrict visitors who do not have an essential need to be in the home.  Keep older adults, very young children, and other sick people away from the patient Keep older adults, very young children, and those who have compromised immune systems or chronic health conditions away from the patient. This includes people with chronic heart, lung, or kidney conditions, diabetes, and cancer.  Ensure good ventilation Make sure that shared spaces in the home have good air flow, such as from an air conditioner or an opened window, weather permitting.  Wash your hands often  Wash your hands often and thoroughly with soap and water for at least 20 seconds. You can use an alcohol based hand sanitizer if soap and water are not available and if your hands are not visibly dirty.  Avoid touching your eyes, nose, and mouth with unwashed hands.  Use disposable paper towels to dry your hands. If not available, use dedicated cloth towels and replace them when they become wet.  Wear a facemask and gloves  Wear a disposable facemask at all times in the room and gloves when you touch or have contact with the patients blood, body fluids, and/or secretions or excretions, such as sweat, saliva, sputum, nasal mucus, vomit, urine, or feces.  Ensure the mask fits over your nose and mouth tightly, and do not touch it during use.  Throw out disposable facemasks and gloves after using them. Do not reuse.  Wash your hands immediately after removing your facemask and gloves.  If your personal clothing becomes contaminated, carefully remove clothing and launder. Wash your hands after handling contaminated clothing.  Place all used disposable facemasks, gloves, and other waste in a lined  container before disposing them with other household waste.  Remove gloves and wash your hands immediately after handling these items.  Do not share dishes, glasses, or other household items with the patient  Avoid sharing household items. You should not share dishes, drinking glasses, cups, eating utensils, towels, bedding, or other items with a patient who is confirmed to have, or being evaluated for, COVID-19 infection.  After the person uses these items, you should wash them thoroughly with soap and water.  Wash laundry thoroughly  Immediately remove and wash clothes or bedding that have blood, body fluids, and/or secretions or excretions, such as sweat, saliva, sputum, nasal mucus, vomit, urine, or feces, on them.  Wear gloves when handling laundry from the patient.  Read and follow directions on labels of laundry or clothing items and detergent. In general, wash and dry with the warmest temperatures recommended on the label.  Clean all areas the individual has used often  Clean all touchable surfaces, such as counters, tabletops, doorknobs, bathroom fixtures, toilets, phones, keyboards, tablets, and bedside tables, every day. Also, clean any surfaces that may have blood, body fluids, and/or secretions or excretions on them.  Wear gloves when cleaning surfaces the patient has come in contact with.  Use a diluted bleach solution (e.g., dilute bleach with 1 part  bleach and 10 parts water) or a household disinfectant with a label that says EPA-registered for coronaviruses. To make a bleach solution at home, add 1 tablespoon of bleach to 1 quart (4 cups) of water. For a larger supply, add  cup of bleach to 1 gallon (16 cups) of water.  Read labels of cleaning products and follow recommendations provided on product labels. Labels contain instructions for safe and effective use of the cleaning product including precautions you should take when applying the product, such as wearing gloves or  eye protection and making sure you have good ventilation during use of the product.  Remove gloves and wash hands immediately after cleaning.  Monitor yourself for signs and symptoms of illness Caregivers and household members are considered close contacts, should monitor their health, and will be asked to limit movement outside of the home to the extent possible. Follow the monitoring steps for close contacts listed on the symptom monitoring form.   ? If you have additional questions, contact your local health department or call the epidemiologist on call at [phone] (available 24/7). ? This guidance is subject to change. For the most up-to-date guidance from [facility], please refer to their website: [email or web address].pl

## 2018-08-21 NOTE — ED Notes (Signed)
Report given to [place] RN. Will transport patient upstairs after CT angio.

## 2018-08-21 NOTE — Progress Notes (Signed)
Checked room air sats. Ambulatory O2 sats dropped to 91%  Sitting O2 sats 96%.

## 2018-08-21 NOTE — Progress Notes (Signed)
Previous nurse gave patient discharge instructions. [facility] was notified of patient's discharge. Awaiting [facility]  transportation for patient.

## 2018-08-21 NOTE — TOC Initial Note (Addendum)
Transition of Care [facility]) - Initial/Assessment Note    Patient Details  Name: Cody Frank MRN: [id number] Date of Birth: 11/24/1970  Transition of Care [facility] CM/SW Contact:    Elliot Cousin, RN Phone Number: 08/21/2018, 7:00 PM  Clinical Narrative:                  Spoke to pt and states he lives with wife and small children. States wife is quarantined and are not able to provided transportation. Will transport pt home via [facility]. Unit RN will recheck oxygen level. Pt states he continues to have shortness of breath. States his PCP is [facility]    Expected Discharge Plan: Home/Self Care Barriers to Discharge: Family Issues, Transportation   Patient Goals and CMS Choice        Expected Discharge Plan and Services Expected Discharge Plan: Home/Self Care   Discharge Planning Services: CM Consult Post Acute Care Choice: NA Living arrangements for the past 2 months: Single Family Home Expected Discharge Date: 08/21/18               DME Arranged: N/A DME Agency: NA HH Arranged: NA HH Agency: NA  Prior Living Arrangements/Services Living arrangements for the past 2 months: Single Family Home Lives with:: Spouse, Minor Children Patient language and need for interpreter reviewed:: Yes Do you feel safe going back to the place where you live?: No   states he is not sure if his family has illness  Need for Family Participation in Patient Care: No (Comment) Care giver support system in place?: Yes (comment)   Criminal Activity/Legal Involvement Pertinent to Current Situation/Hospitalization: No - Comment as needed  Activities of Daily Living Home Assistive Devices/Equipment: None ADL Screening (condition at time of admission) Patient's cognitive ability adequate to safely complete daily activities?: Yes Is the patient deaf or have difficulty hearing?: No Does the patient have difficulty seeing, even when wearing glasses/contacts?: No Does the patient have  difficulty concentrating, remembering, or making decisions?: No Patient able to express need for assistance with ADLs?: Yes Does the patient have difficulty dressing or bathing?: No Independently performs ADLs?: Yes (appropriate for developmental age) Does the patient have difficulty walking or climbing stairs?: No Weakness of Legs: Both Weakness of Arms/Hands: Both  Permission Sought/Granted Permission sought to share information with : Case Manager, PCP, Family Supports Permission granted to share information with : Yes, Verbal Permission Granted              Emotional Assessment Appearance:: (COVID-19)   Affect (typically observed): Afraid/Fearful, Sad Orientation: : Oriented to Self, Oriented to Place, Oriented to  Time, Oriented to Situation   Psych Involvement: No (comment)  Admission diagnosis:  COVID-19 virus infection [U07.1] Patient Active Problem List   Diagnosis Date Noted  . Diarrhea 08/21/2018  . COVID-19 08/21/2018  . COVID-19 virus infection 08/20/2018  . Viral respiratory infection 08/17/2018  . Cough 08/17/2018  . Myalgia 08/17/2018  . Advice Given About 2019 Novel Coronavirus Infection 08/17/2018  . Anemia 04/26/2014  . 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 GERD (gastroesophageal reflux disease)   . Back pain   . Hyperlipidemia 04/02/2010  . Headache 04/24/2007   [facility]P: [facility]  Pharmacy:   [id number] - [place], [place] - [address] [place] [address] Phone: [phone] Fax: [phone]     Social Determinants of Health (SDOH) Interventions    Readmission Risk Interventions No flowsheet data found.

## 2018-08-21 NOTE — Discharge Summary (Addendum)
Cody Frank, is a 48 y.o. male  DOB 05-29-1970  MRN [id number].  Admission date:  08/20/2018  Admitting Physician  John Giovanni, MD  Discharge Date:  08/21/2018   Primary MD  Patient, No Pcp Per  Recommendations for primary care physician for things to follow:  -Check CBC, BMP, LFT during next visit -Patient was given the instruction of [facility] Department of Health infection prevention recommendation for COVID-19   Admission Diagnosis  COVID-19 virus infection [U07.1]   Discharge Diagnosis  COVID-19 virus infection [U07.1]    Principal Problem:   COVID-19 virus infection Active Problems:   Headache   Back pain   Anemia   Diarrhea   COVID-19      Past Medical History:  Diagnosis Date   Allergic rhinitis    Eczema    GERD (gastroesophageal reflux disease)    Headache(784.0)    History of chicken pox    Titered on 10/22/2010   Hoarseness    Hyperlipidemia    Low back pain syndrome    Rx'ed MRI by Dr. Newell Coral    Past Surgical History:  Procedure Laterality Date   NO PAST SURGERIES         History of present illness and  Hospital Course:     Kindly see H&P for history of present illness and admission details, please review complete Labs, Consult reports and Test reports for all details in brief  HPI  from the history and physical done on the day of admission 08/20/2018 HPI: Cody Frank is a 48 y.o. male with medical history significant of GERD, hyperlipidemia, allergic rhinitis, eczema, low back pain presenting to the hospital for evaluation of shortness of breath and cough. SPO2 93% per EMS and was placed on nonrebreather at 10 L.  Placed on 2 L supplemental oxygen in the ED and maintaining oxygen saturation in the high 90s.  Patient reports 4 to 5-day history of fevers, chills, dyspnea, nonproductive cough, body aches, and generalized headaches.  He is also  having watery, nonbloody diarrhea for the past 3 days.  Denies any chest pain, abdominal pain, nausea, or vomiting.  States he was seen at a doctor's office in [facility] yesterday and COVID testing was done.  He was called today to inform him that he tested positive.   Hospital Course   COVID-19 infection -COVID-19 testing done yesterday is positive, repeat in hospital positive as well, initially requiring some oxygen, this morning on room air he ambulated multiple times in his room saturation sustained at 97%, he was started on Plaquenil and azithromycin during hospital stay, he will be discharged on another 4 days of Plaquenil on discharge, I have discussed with patient at length regardingawake prone , monitor his pulse ox closely, and to call PCP or ED if it is 91% or less -Continue with vitamin C and zinc on discharge -D-dimers were elevated, CTA negative for PE -She was instructed to take Tylenol for pain, and to hold further NSAIDs -Patient had extreme anxiety during hospital stay,  he did report some intermittent chest pain, nontypical, his troponins were negative, and EKG was nonacute, as well his CTA chest negative for PE -Patient is extremely anxious, and keep reporting subjective dyspnea, even though his oxygen saturation mains above 95% all times, even with ambulation multiple times in his room, have explained for him, his COVID-19 can be managed at home currently, given no oxygen requirement, have instructed him to call ED or PCP if he is having low oxygen saturation.  Discharge Condition:  stable   Discharge Instructions  and  Discharge Medications     Discharge Instructions    Discharge instructions   Complete by:  As directed    -Please monitor your pulse oximetry daily, and call PCP or ED if at 91% or less -Continue was called awake prone position, which means to lay flat on your abdomen for extended hours up to 16 hours every day. -Take Tylenol  for fever, avoid  NSAIDs   Follow with Primary MD Patient, No Pcp Per in 7 days   Get CBC, CMP, 2 view Chest X ray checked  by Primary MD next visit.    Activity: As tolerated with Full fall precautions use walker/cane & assistance as needed   Disposition Home **   Diet: Heart Healthy ** , with feeding assistance and aspiration precautions.  For Heart failure patients - Check your Weight same time everyday, if you gain over 2 pounds, or you develop in leg swelling, experience more shortness of breath or chest pain, call your Primary MD immediately. Follow Cardiac Low Salt Diet and 1.5 lit/day fluid restriction.   On your next visit with your primary care physician please Get Medicines reviewed and adjusted.   Please request your Prim.MD to go over all Hospital Tests and Procedure/Radiological results at the follow up, please get all Hospital records sent to your Prim MD by signing hospital release before you go home.   If you experience worsening of your admission symptoms, develop shortness of breath, life threatening emergency, suicidal or homicidal thoughts you must seek medical attention immediately by calling 911 or calling your MD immediately  if symptoms less severe.  You Must read complete instructions/literature along with all the possible adverse reactions/side effects for all the Medicines you take and that have been prescribed to you. Take any new Medicines after you have completely understood and accpet all the possible adverse reactions/side effects.   Do not drive, operating heavy machinery, perform activities at heights, swimming or participation in water activities or provide baby sitting services if your were admitted for syncope or siezures until you have seen by Primary MD or a Neurologist and advised to do so again.  Do not drive when taking Pain medications.    Do not take more than prescribed Pain, Sleep and Anxiety Medications  Special Instructions: If you have smoked or  chewed Tobacco  in the last 2 yrs please stop smoking, stop any regular Alcohol  and or any Recreational drug use.  Wear Seat belts while driving.   Please note  You were cared for by a hospitalist during your hospital stay. If you have any questions about your discharge medications or the care you received while you were in the hospital after you are discharged, you can call the unit and asked to speak with the hospitalist on call if the hospitalist that took care of you is not available. Once you are discharged, your primary care physician will handle any further medical issues. Please note  that NO REFILLS for any discharge medications will be authorized once you are discharged, as it is imperative that you return to your primary care physician (or establish a relationship with a primary care physician if you do not have one) for your aftercare needs so that they can reassess your need for medications and monitor your lab values.   Increase activity slowly   Complete by:  As directed    MyChart COVID-19 home monitoring program   Complete by:  Aug 21, 2018    Is the patient willing to use a smartphone for remote monitoring via the MyChart app?:  Yes     Allergies as of 08/21/2018      Reactions   Amoxicillin Nausea And Vomiting      Medication List    STOP taking these medications   ibuprofen 800 MG tablet Commonly known as:  ADVIL   nabumetone 750 MG tablet Commonly known as:  RELAFEN   naproxen 375 MG tablet Commonly known as:  NAPROSYN     TAKE these medications   acetaminophen 325 MG tablet Commonly known as:  TYLENOL Take 2 tablets (650 mg total) by mouth every 6 (six) hours as needed for mild pain or headache (fever >/= 101).   albuterol 108 (90 Base) MCG/ACT inhaler Commonly known as:  VENTOLIN HFA Inhale 1-2 puffs into the lungs every 6 (six) hours as needed for wheezing or shortness of breath.   benzonatate 100 MG capsule Commonly known as:  TESSALON Take 1  capsule (100 mg total) by mouth 3 (three) times daily as needed for cough.   fluticasone 50 MCG/ACT nasal spray Commonly known as:  FLONASE Place 2 sprays into both nostrils daily for 7 days.   guaiFENesin 600 MG 12 hr tablet Commonly known as:  Mucinex Take 2 tablets (1,200 mg total) by mouth 2 (two) times daily for 14 days.   hydroxychloroquine 200 MG tablet Commonly known as:  PLAQUENIL Take 1 tablet (200 mg total) by mouth 2 (two) times daily.   methocarbamol 500 MG tablet Commonly known as:  ROBAXIN Take 1 tablet (500 mg total) by mouth every 8 (eight) hours as needed for muscle spasms.   pantoprazole 40 MG tablet Commonly known as:  PROTONIX Take 1 tablet (40 mg total) by mouth daily before breakfast.   vitamin C 250 MG tablet Commonly known as:  ASCORBIC ACID Take 250 mg by mouth daily. What changed:  Another medication with the same name was added. Make sure you understand how and when to take each.   ascorbic acid 500 MG tablet Commonly known as:  VITAMIN C Take 1 tablet (500 mg total) by mouth 2 (two) times daily for 14 days. Please take for 14 days What changed:  You were already taking a medication with the same name, and this prescription was added. Make sure you understand how and when to take each.   zinc sulfate 220 (50 Zn) MG capsule Take 1 capsule (220 mg total) by mouth daily for 14 days. Please take for 14 days Start taking on:  August 22, 2018         Diet and Activity recommendation: See Discharge Instructions above   Consults obtained -  None   Major procedures and Radiology Reports - PLEASE review detailed and final reports for all details, in brief -   Ct Angio Chest Pe W Or Wo Contrast  Result Date: 08/21/2018 CLINICAL DATA:  48 y/o M; PE suspected, intermediate prob, positive D-dimer  COVID-19 positive. Ten days of shortness of breath and cough. Elevated D-dimer. EXAM: CT ANGIOGRAPHY CHEST WITH CONTRAST TECHNIQUE: Multidetector CT imaging  of the chest was performed using the standard protocol during bolus administration of intravenous contrast. Multiplanar CT image reconstructions and MIPs were obtained to evaluate the vascular anatomy. CONTRAST:  OMNIPAQUE IOHEXOL 350 MG/ML SOLN COMPARISON:  08/20/2018 chest radiograph FINDINGS: Cardiovascular: Satisfactory opacification of the pulmonary arteries to the segmental level. No evidence of pulmonary embolism. Normal heart size. No pericardial effusion. Mediastinum/Nodes: No enlarged mediastinal, hilar, or axillary lymph nodes. Thyroid gland, trachea, and esophagus demonstrate no significant findings. Lungs/Pleura: Peribronchial and peripheral ground-glass opacities as well as consolidation with features of subpleural sparing and intralobular septal thickening in a predominant peripheral and basilar distribution. Upper Abdomen: No acute abnormality. Musculoskeletal: No chest wall abnormality. No acute or significant osseous findings. Review of the MIP images confirms the above findings. IMPRESSION: 1. No pulmonary embolus identified. 2. There are a spectrum of findings in the lungs which can be seen with acute atypical infection (as well as other non-infectious etiologies). In particular, viral pneumonia (including COVID-19) should be considered in the appropriate clinical setting. Critical Value/emergent results were called by telephone at the time of interpretation on 08/21/2018 at 3:08 am to Dr. Clyde Lundborg, who verbally acknowledged these results. Electronically Signed   By: Mitzi Hansen M.D.   On: 08/21/2018 03:25   Dg Chest Port 1 View  Result Date: 08/20/2018 CLINICAL DATA:  Shortness of breath and cough for 10 days. Positive test for COVID-19. EXAM: PORTABLE CHEST 1 VIEW COMPARISON:  08/16/2018 FINDINGS: Telemetry leads overlie the chest. The cardiomediastinal silhouette is within normal limits for portable AP technique. The lungs are less well inflated than on the prior study. The  interstitial markings are slightly increased bilaterally compared to the prior study, and there is minimal asymmetric opacity in the left perihilar region and left lung base. No frank airspace consolidation is evident. No pleural effusion or pneumothorax is identified. No acute osseous abnormality is seen. IMPRESSION: Mildly increased interstitial type densities in the left greater than right lungs. Electronically Signed   By: Sebastian Ache M.D.   On: 08/20/2018 21:37   Dg Chest Portable 1 View  Result Date: 08/16/2018 CLINICAL DATA:  48 y/o M; sneeze, cough, sore throat, headache. Fever 2 days ago. EXAM: PORTABLE CHEST 1 VIEW COMPARISON:  11/24/2017 chest radiograph. FINDINGS: Stable heart size and mediastinal contours are within normal limits. No consolidation, effusion, or pneumothorax. The visualized skeletal structures are unremarkable. IMPRESSION: No acute pulmonary process identified. Electronically Signed   By: Mitzi Hansen M.D.   On: 08/16/2018 21:43    Micro Results    Recent Results (from the past 240 hour(s))  SARS Coronavirus 2 [facility] order, Performed in [facility] hospital lab)     Status: Abnormal   Collection Time: 08/20/18  9:16 PM  Result Value Ref Range Status   SARS Coronavirus 2 POSITIVE (A) NEGATIVE Final    Comment: RESULT CALLED TO, READ BACK BY AND VERIFIED WITH: T HAMLETT RN [id number] 08/20/18 A NAVARRO (NOTE) If result is NEGATIVE SARS-CoV-2 target nucleic acids are NOT DETECTED. The SARS-CoV-2 RNA is generally detectable in upper and lower  respiratory specimens during the acute phase of infection. The lowest  concentration of SARS-CoV-2 viral copies this assay can detect is 250  copies / mL. A negative result does not preclude SARS-CoV-2 infection  and should not be used as the sole basis for treatment or other  patient management decisions.  A negative result may occur with  improper specimen collection / handling, submission of specimen other  than  nasopharyngeal swab, presence of viral mutation(s) within the  areas targeted by this assay, and inadequate number of viral copies  (<250 copies / mL). A negative result must be combined with clinical  observations, patient history, and epidemiological information. If result is POSITIVE SARS-CoV-2 target nucleic acids are DETECTED.  The SARS-CoV-2 RNA is generally detectable in upper and lower  respiratory specimens during the acute phase of infection.  Positive  results are indicative of active infection with SARS-CoV-2.  Clinical  correlation with patient history and other diagnostic information is  necessary to determine patient infection status.  Positive results do  not rule out bacterial infection or co-infection with other viruses. If result is PRESUMPTIVE POSTIVE SARS-CoV-2 nucleic acids MAY BE PRESENT.   A presumptive positive result was obtained on the submitted specimen  and confirmed on repeat testing.  While 2019 novel coronavirus  (SARS-CoV-2) nucleic acids may be present in the submitted sample  additional confirmatory testing may be necessary for epidemiological  and / or clinical management purposes  to differentiate between  SARS-CoV-2 and other Sarbecovirus currently known to infect humans.  If clinically indicated additional testing with an alternate test  methodology [phone]([id number]) i s advised. The SARS-CoV-2 RNA is generally  detectable in upper and lower respiratory specimens during the acute  phase of infection. The expected result is Negative. Fact Sheet for Patients:  BoilerBrush.com.cyhttps://www.fda.gov/media/136312/download Fact Sheet for Healthcare Providers: https://pope.com/https://www.fda.gov/media/136313/download This test is not yet approved or cleared by the [place] FDA and has been authorized for detection and/or diagnosis of SARS-CoV-2 by FDA under an Emergency Use Authorization (EUA).  This EUA will remain in effect (meaning this test can be used) for the duration of  the COVID-19 declaration under Section 564(b)(1) of the Act, 21 U.S.C. section 360bbb-3(b)(1), unless the authorization is terminated or revoked sooner. Performed at [facility], [address] [place], [place] [address]   Blood Culture (routine x 2)     Status: None (Preliminary result)   Collection Time: 08/20/18  9:17 PM  Result Value Ref Range Status   Specimen Description   Final    BLOOD RIGHT ANTECUBITAL Performed at [facility], [address] [place], [place] [address]    Special Requests   Final    BOTTLES DRAWN AEROBIC AND ANAEROBIC Blood Culture results may not be optimal due to an excessive volume of blood received in culture bottles Performed at [facility], [address] [place], [place] [address]    Culture   Final    NO GROWTH < 24 HOURS Performed at [facility], [address] [place], [place] [address]    Report Status PENDING  Incomplete  Blood Culture (routine x 2)     Status: None (Preliminary result)   Collection Time: 08/20/18  9:17 PM  Result Value Ref Range Status   Specimen Description   Final    BLOOD RIGHT HAND Performed at [facility], [address] [place], [place] [address]    Special Requests   Final    BOTTLES DRAWN AEROBIC ONLY Blood Culture adequate volume Performed at [facility], [address] [place], [place] [address]    Culture   Final    NO GROWTH < 24 HOURS Performed at [facility], [address] [place], [place]  [address]    Report Status PENDING  Incomplete       Today   Subjective:   Cody Frank today has no headache, he does report some dyspnea, but oxygen saturation stayed 97% on ambulation on room air, reports cough, nonproductive, reports congestion, had some intermittent chest pain earlier today with coughing, currently resolved .  Objective:   Blood pressure 121/76, pulse 76, temperature  99.5 F (37.5 C), temperature source Oral, resp. rate 20, height 5\' 11"  (1.803 m), weight 77.7 kg, SpO2 96 %.   Intake/Output Summary (Last 24 hours) at 08/21/2018 1706 Last data filed at 08/21/2018 0913 Gross per 24 hour  Intake 1084.84 ml  Output 300 ml  Net 784.84 ml    Exam Awake Alert, Oriented x 3, No new F.N deficits, Normal affect Symmetrical Chest wall movement, Good air movement bilaterally, CTAB RRR,No Gallops,Rubs or new Murmurs, No Parasternal Heave +ve B.Sounds, Abd Soft, Non tender, No rebound -guarding or rigidity. No Cyanosis, Clubbing or edema, No new Rash or bruise  Data Review   CBC w Diff:  Lab Results  Component Value Date   WBC 5.2 08/20/2018   HGB 12.8 (L) 08/20/2018   HGB 12.4 (L) 09/30/2017   HCT 41.6 08/20/2018   HCT 40.1 09/30/2017   PLT 210 08/20/2018   PLT 257 09/30/2017   LYMPHOPCT 20 08/20/2018   MONOPCT 7 08/20/2018   EOSPCT 1 08/20/2018   BASOPCT 0 08/20/2018    CMP:  Lab Results  Component Value Date   NA 133 (L) 08/21/2018   NA 140 09/30/2017   K 3.9 08/21/2018   CL 103 08/21/2018   CO2 22 08/21/2018   BUN 10 08/21/2018   BUN 18 09/30/2017   CREATININE 0.92 08/21/2018   CREATININE 1.04 11/23/2010   PROT 8.3 (H) 08/20/2018   PROT 7.2 09/30/2017   ALBUMIN 4.0 08/20/2018   ALBUMIN 4.5 09/30/2017   BILITOT 1.0 08/20/2018   BILITOT 0.4 09/30/2017   ALKPHOS 65 08/20/2018   AST 73 (H) 08/20/2018   ALT 47 (H) 08/20/2018  .   Total Time in preparing paper work, data evaluation and todays exam - 35 minutes  Huey Bienenstock M.D on 08/21/2018 at 5:06 PM  [facility]   Office  [phone]

## 2018-08-21 NOTE — Progress Notes (Signed)
[facility] came to transport the patient. Surgical mask placed on the patient. Telemetry removed from the patient and kept in room, for "cleaning and zapping" by the EVS. Patient had phone,charger and a bag of belongings to take home with him.

## 2018-08-22 LAB — HIV ANTIBODY (ROUTINE TESTING W REFLEX): HIV Screen 4th Generation wRfx: NONREACTIVE

## 2018-08-25 LAB — CULTURE, BLOOD (ROUTINE X 2)
Culture: NO GROWTH
Culture: NO GROWTH
Special Requests: ADEQUATE

## 2018-09-30 ENCOUNTER — Telehealth: Payer: Self-pay | Admitting: *Deleted

## 2018-09-30 NOTE — Telephone Encounter (Signed)
I called pt and asked if he would be willing to donate plasma to assist other pts with the COVID-19  possibly get better.   He asked some questions about how it is done and if his plasma could transmit the virus to the other person getting his plasma.   I explained to him that it would not and how it's the antibodies from his plasma we use to assist others to get better.  He was willing to donate.   I gave him the web site [email or web address] and explained he would need to fill out the application and that someone would be in contact with him to set up the donation process and where to go. He thanked me for calling.  He was in the hospital 08/20/2018 for 25 hours then discharged home.   No fever on discharge.  It has been greater than 28 days since he had symptoms.

## 2018-10-21 ENCOUNTER — Other Ambulatory Visit: Payer: Self-pay | Admitting: Family Medicine

## 2018-10-21 ENCOUNTER — Ambulatory Visit
Admission: RE | Admit: 2018-10-21 | Discharge: 2018-10-21 | Disposition: A | Discharge status: Home / Self Care | Payer: Commercial Managed Care - PPO | Source: Ambulatory Visit | Attending: Family Medicine | Admitting: Family Medicine

## 2018-10-21 DIAGNOSIS — R0609 Other forms of dyspnea: Secondary | ICD-10-CM

## 2019-07-07 IMAGING — DX PORTABLE CHEST - 1 VIEW
1 series · 1 of 1 positions shown · non-contrast
Comparison: 11/24/2017 chest radiograph.

CLINICAL DATA: 48 y/o M; sneeze, cough, sore throat, headache.
Fever 2 days ago.

EXAM:
PORTABLE CHEST 1 VIEW

[chest ap]
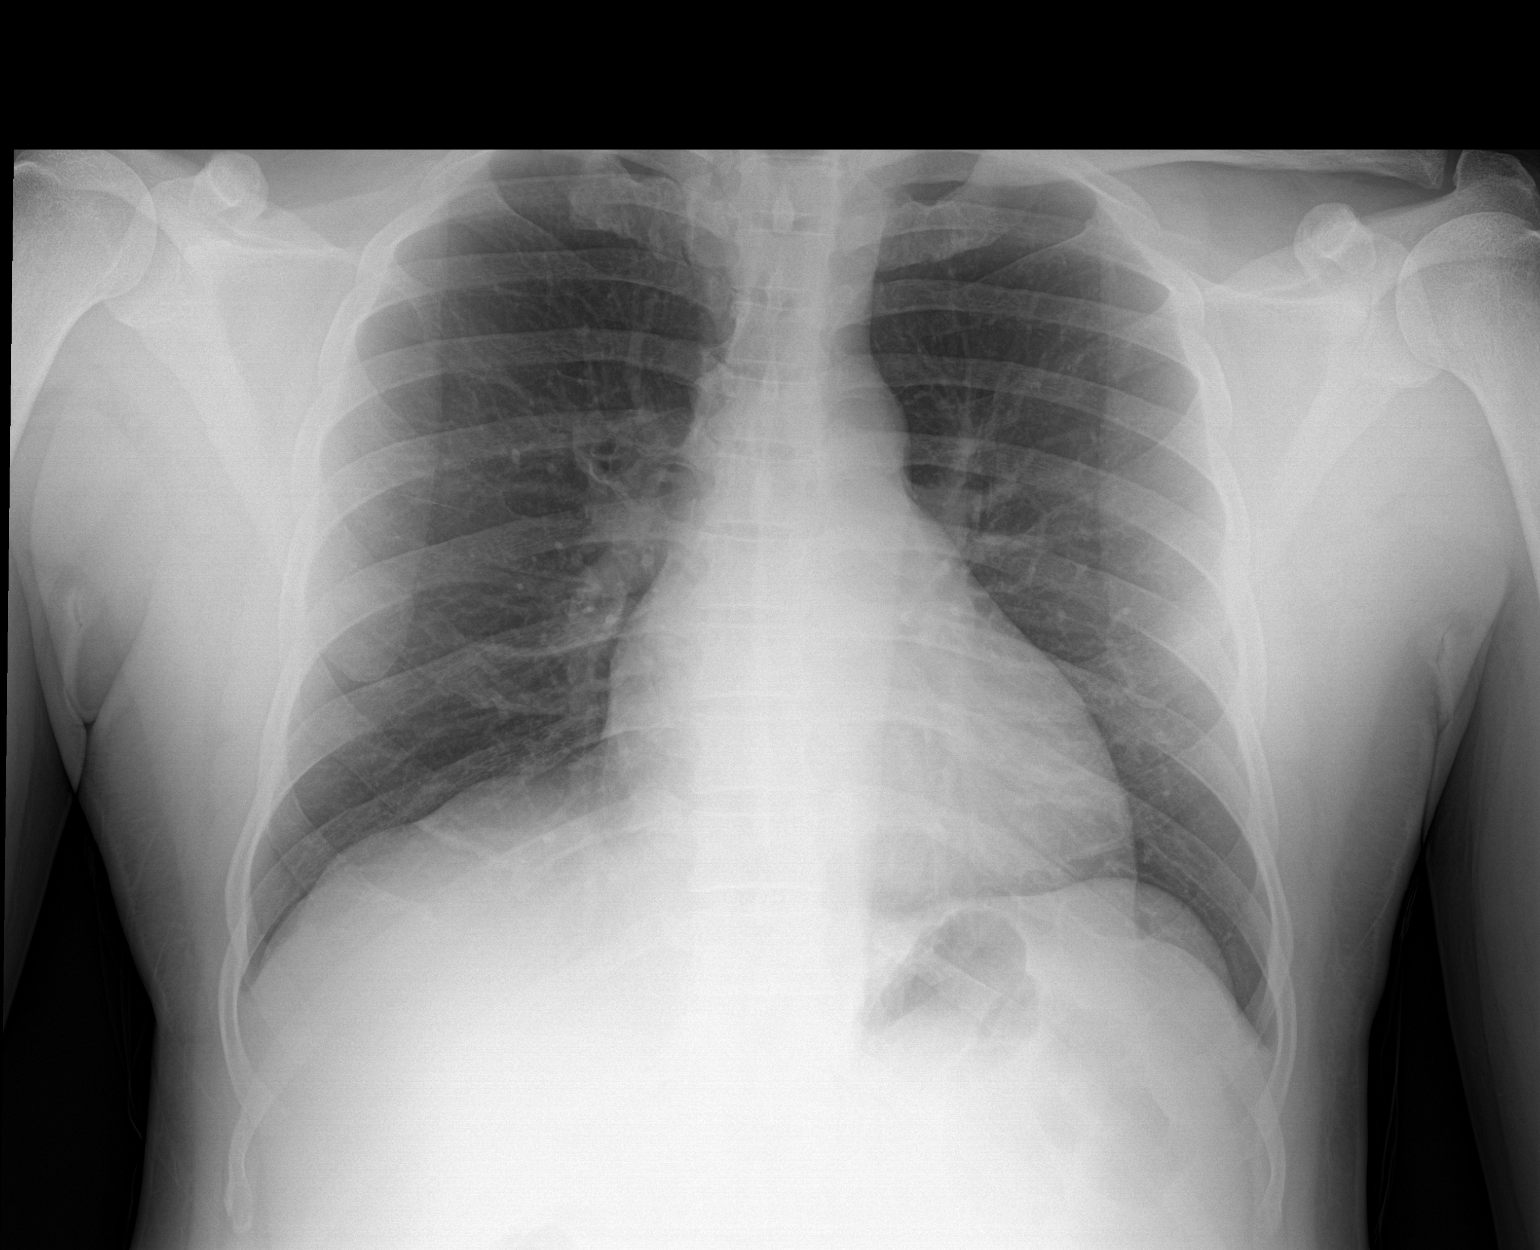

[1 of 1 positions shown; findings below may reference images not displayed]

FINDINGS: Stable heart size and mediastinal contours are within normal limits.
No consolidation, effusion, or pneumothorax. The visualized skeletal
structures are unremarkable.
IMPRESSION: No acute pulmonary process identified.

## 2019-07-12 IMAGING — CT CT ANGIOGRAPHY CHEST
2 of 6 series · 18 of 46 positions shown · IV contrast (OMNIPAQUE)
Comparison: 08/20/2018 chest radiograph

CLINICAL DATA: 48 y/o M; PE suspected, intermediate prob, positive
D-dimer ZFXCQ-N1 positive. Ten days of shortness of breath and
cough. Elevated D-dimer.

EXAM:
CT ANGIOGRAPHY CHEST WITH CONTRAST
TECHNIQUE: Multidetector CT imaging of the chest was performed using the
standard protocol during bolus administration of intravenous
contrast. Multiplanar CT image reconstructions and MIPs were
obtained to evaluate the vascular anatomy.
CONTRAST:  100mL OMNIPAQUE IOHEXOL 350 MG/ML SOLN

[Series 7: thins · axial · 0.60mm/px · z∈[+1605,+1834]mm · 16 of 251 slices shown]
[im 11/251  lung]
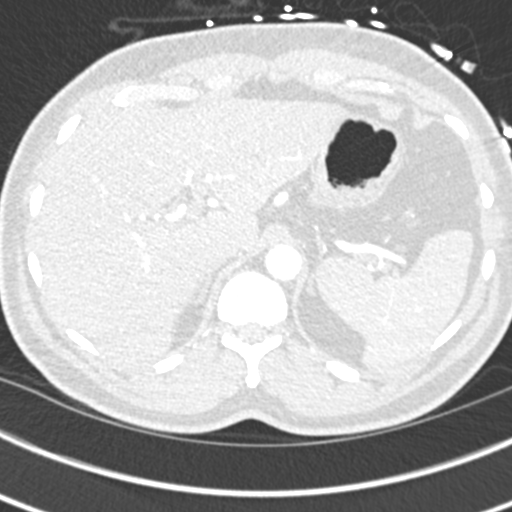
[im 33/251  soft-tissue]
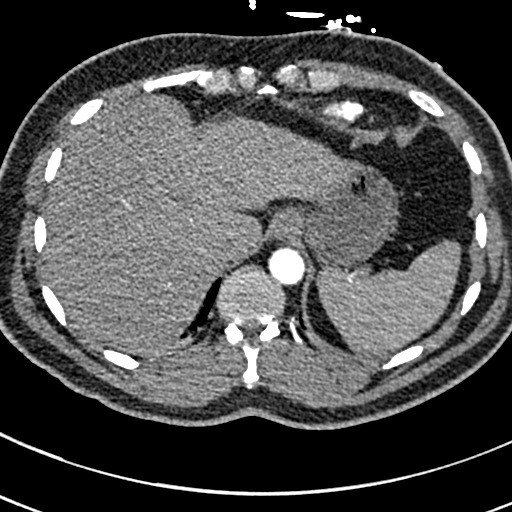
[im 44/251  lung]
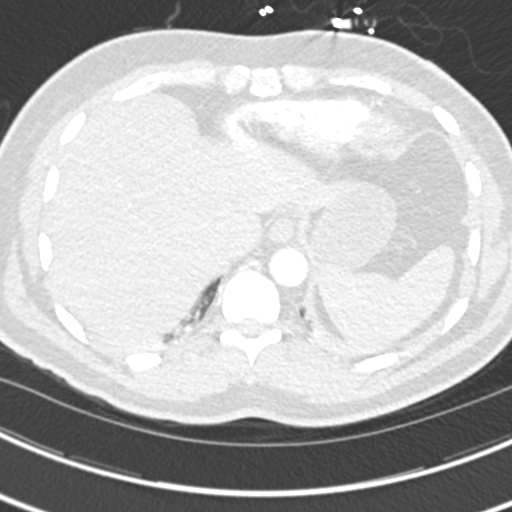
[im 55/251  soft-tissue]
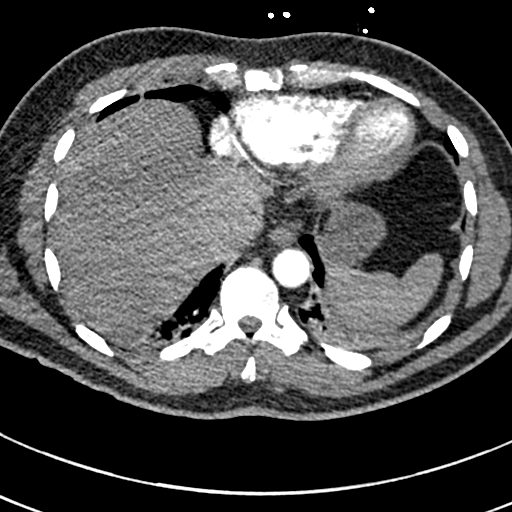
[im 77/251  lung]
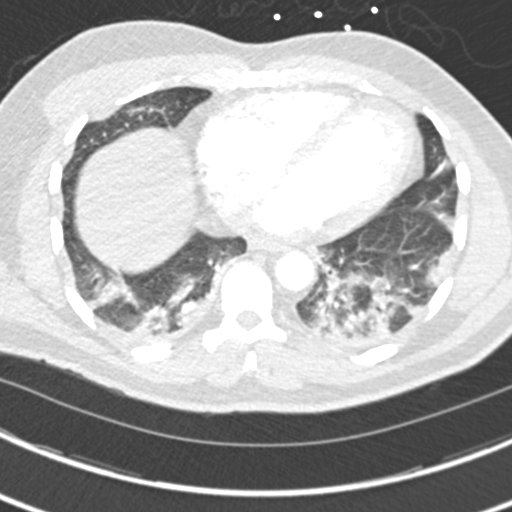
[im 87/251  soft-tissue]
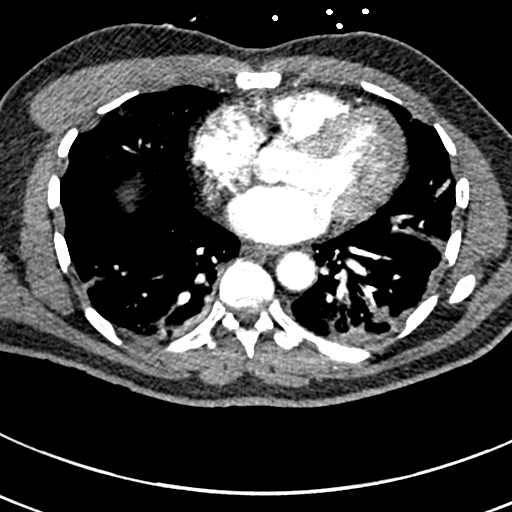
[im 98/251  lung]
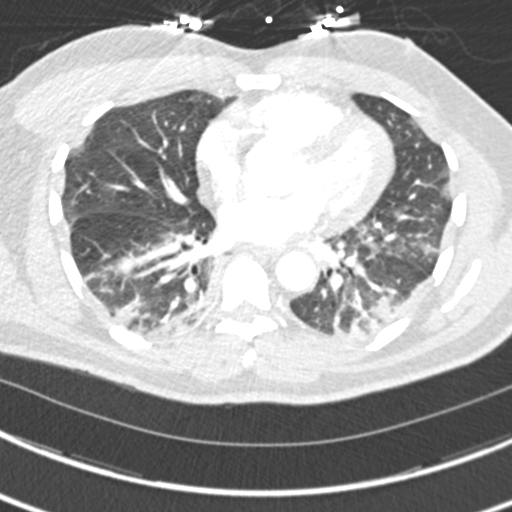
[im 120/251  soft-tissue]
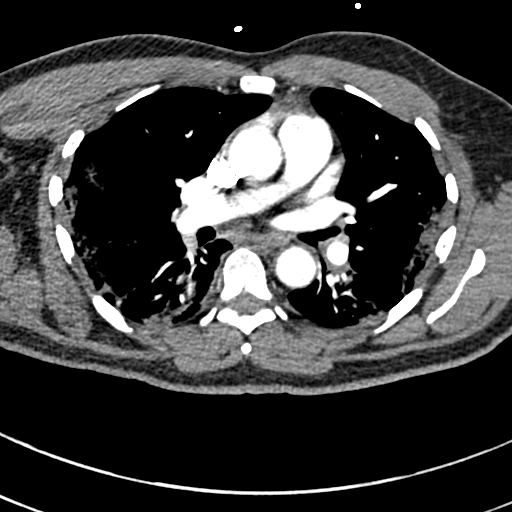
[im 131/251  lung]
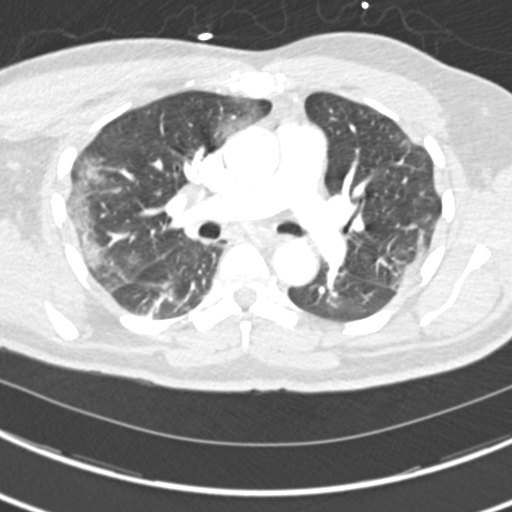
[im 153/251  soft-tissue]
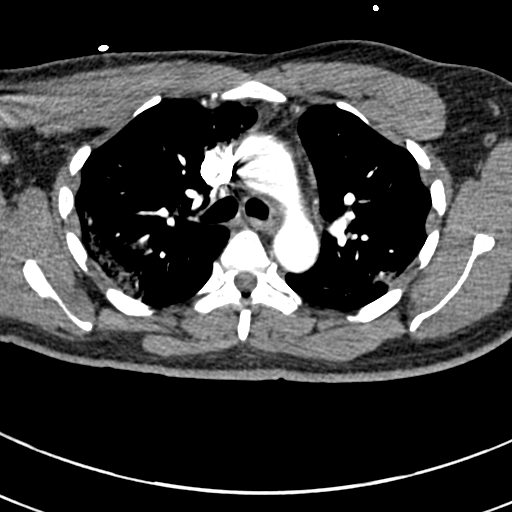
[im 164/251  lung]
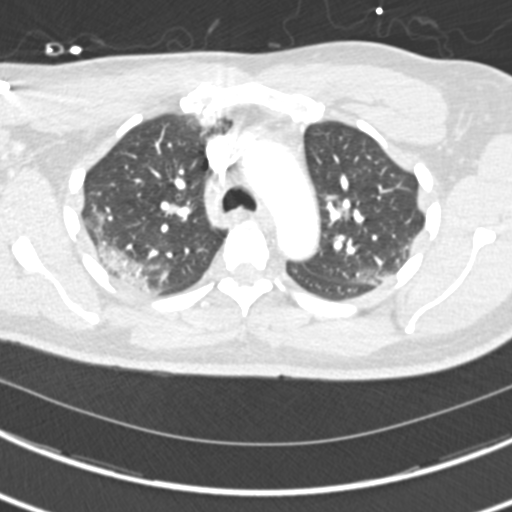
[im 174/251  soft-tissue]
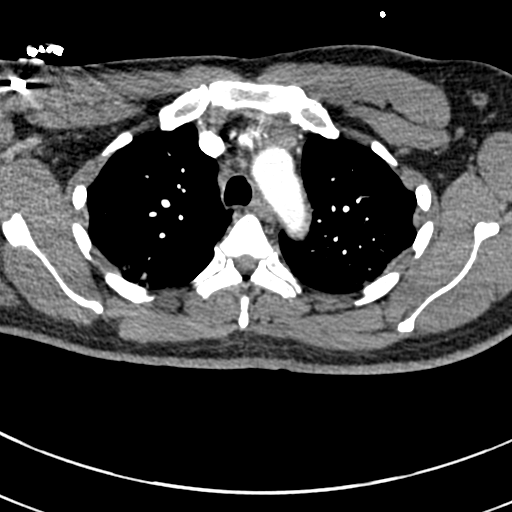
[im 196/251  lung]
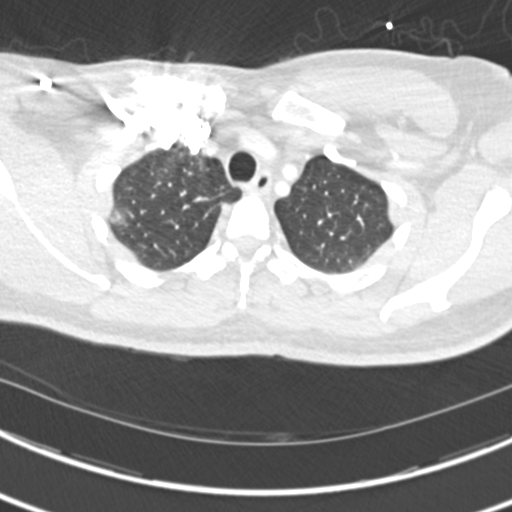
[im 207/251  soft-tissue]
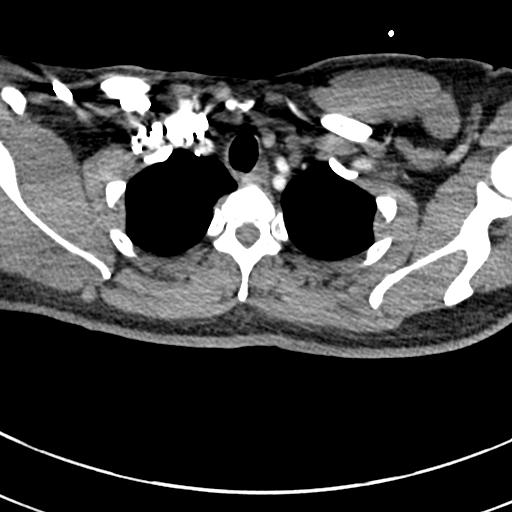
[im 218/251  lung]
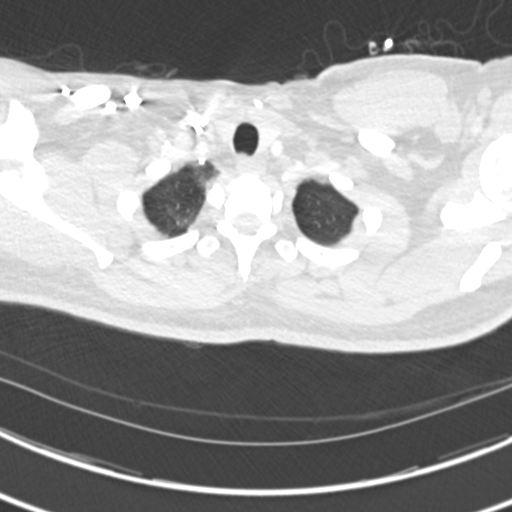
[im 240/251  soft-tissue]
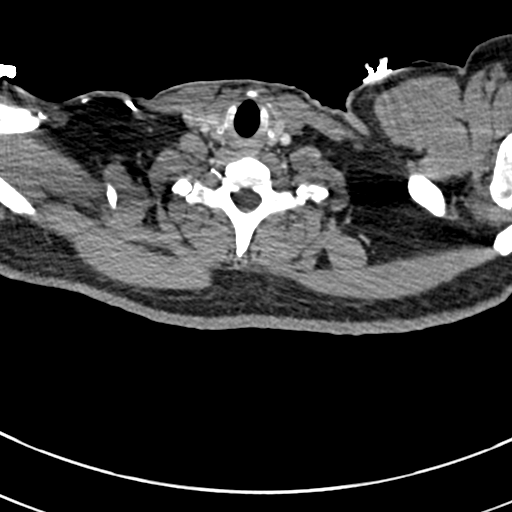

[Series 9: coronal mpr · coronal · 0.49mm/px · 2 of 72 slices shown]
[im 24/72  soft-tissue]
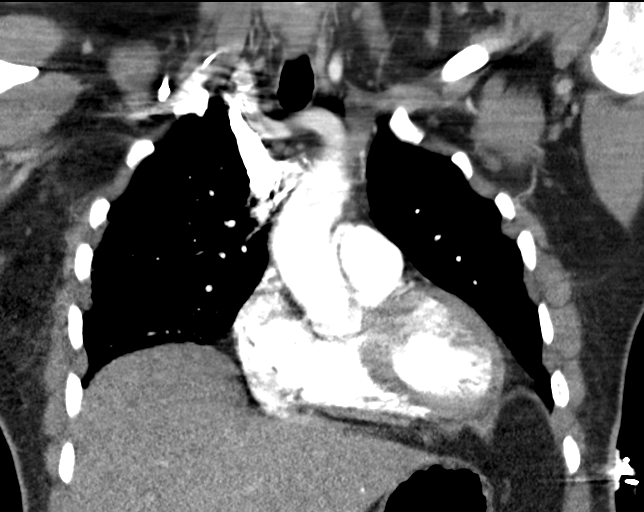
[im 48/72  soft-tissue]
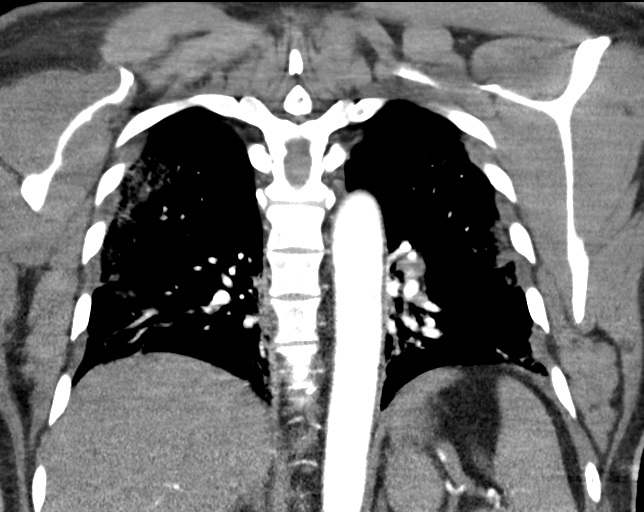

[18 of 46 positions shown; findings below may reference images not displayed]

FINDINGS: Cardiovascular: Satisfactory opacification of the pulmonary arteries
to the segmental level. No evidence of pulmonary embolism. Normal
heart size. No pericardial effusion.

Mediastinum/Nodes: No enlarged mediastinal, hilar, or axillary lymph
nodes. Thyroid gland, trachea, and esophagus demonstrate no
significant findings.

Lungs/Pleura: Peribronchial and peripheral ground-glass opacities as
well as consolidation with features of subpleural sparing and
intralobular septal thickening in a predominant peripheral and
basilar distribution.

Upper Abdomen: No acute abnormality.

Musculoskeletal: No chest wall abnormality. No acute or significant
osseous findings.

Review of the MIP images confirms the above findings.
IMPRESSION: 1. No pulmonary embolus identified.
2. There are a spectrum of findings in the lungs which can be seen
with acute atypical infection (as well as other non-infectious
etiologies). In particular, viral pneumonia (including ZFXCQ-N1)
should be considered in the appropriate clinical setting.

Critical Value/emergent results were called by telephone at the time
of interpretation on 08/21/2018 at [DATE] to Dr. Yomaira, who verbally
acknowledged these results.

## 2019-09-09 ENCOUNTER — Ambulatory Visit: Payer: Commercial Managed Care - PPO | Attending: Family

## 2019-09-09 DIAGNOSIS — Z23 Encounter for immunization: Secondary | ICD-10-CM

## 2019-09-09 NOTE — Progress Notes (Signed)
   Cov[facility] Vaccination [facility]  Name:  Cody Frank    MRN: [id number] DOB: [birth year]  09/09/2019  [name] was observed post Covid-19 immunization for 15 minutes without incident. He was provided with Vaccine Information Sheet and instruction to access the V-Safe system.   [name] was instructed to call 911 with any severe reactions post vaccine: Marland Kitchen Difficulty breathing  . Swelling of face and throat  . A fast heartbeat  . A bad rash all over body  . Dizziness and weakness   Immunizations Administered    Name Date Dose VIS Date Route   Moderna COVID-19 Vaccine 09/09/2019  3:40 PM 0.5 mL 04/2019 Intramuscular   Manufacturer: Moderna   Lot: [id number]   NDC: 97847-841-28

## 2020-04-13 ENCOUNTER — Ambulatory Visit: Payer: Commercial Managed Care - PPO | Attending: Internal Medicine

## 2020-04-13 DIAGNOSIS — Z23 Encounter for immunization: Secondary | ICD-10-CM

## 2020-04-13 NOTE — Progress Notes (Signed)
   Covid-19 Vaccination Clinic  Name:  Brecken Dewoody    MRN: [id number] DOB: 07-10-1970  04/13/2020  Mr. Kauth was observed post Covid-19 immunization for 15 minutes without incident. He was provided with Vaccine Information Sheet and instruction to access the V-Safe system.   Mr. Haggart was instructed to call 911 with any severe reactions post vaccine: Marland Kitchen Difficulty breathing  . Swelling of face and throat  . A fast heartbeat  . A bad rash all over body  . Dizziness and weakness   Immunizations Administered    No immunizations on file.

## 2022-05-08 ENCOUNTER — Other Ambulatory Visit (HOSPITAL_BASED_OUTPATIENT_CLINIC_OR_DEPARTMENT_OTHER): Payer: Self-pay | Admitting: Family Medicine

## 2022-05-08 DIAGNOSIS — R222 Localized swelling, mass and lump, trunk: Secondary | ICD-10-CM

## 2022-05-09 ENCOUNTER — Ambulatory Visit (HOSPITAL_BASED_OUTPATIENT_CLINIC_OR_DEPARTMENT_OTHER)
Admission: RE | Admit: 2022-05-09 | Discharge: 2022-05-09 | Disposition: A | Discharge status: Home / Self Care | Payer: 59 | Source: Ambulatory Visit | Attending: Family Medicine | Admitting: Family Medicine

## 2022-05-09 DIAGNOSIS — R222 Localized swelling, mass and lump, trunk: Secondary | ICD-10-CM | POA: Diagnosis not present
